# Patient Record
Sex: Female | Born: 1990 | Race: Black or African American | Hispanic: No | Marital: Single | Smoking: Former smoker
Health system: Southern US, Community
[De-identification: ages and names within clinical notes are randomized; demographics above are authoritative.]

## PROBLEM LIST (undated history)

## (undated) DIAGNOSIS — E119 Type 2 diabetes mellitus without complications: Secondary | ICD-10-CM

## (undated) HISTORY — PX: DILITATION & CURRETTAGE/HYSTROSCOPY WITH ESSURE: SHX5573

---

## 2008-07-05 ENCOUNTER — Emergency Department (HOSPITAL_BASED_OUTPATIENT_CLINIC_OR_DEPARTMENT_OTHER): Admission: EM | Admit: 2008-07-05 | Discharge: 2008-07-06 | Payer: Self-pay | Admitting: Emergency Medicine

## 2008-11-12 ENCOUNTER — Emergency Department (HOSPITAL_BASED_OUTPATIENT_CLINIC_OR_DEPARTMENT_OTHER): Admission: EM | Admit: 2008-11-12 | Discharge: 2008-11-12 | Payer: Self-pay | Admitting: Emergency Medicine

## 2009-06-07 ENCOUNTER — Emergency Department (HOSPITAL_BASED_OUTPATIENT_CLINIC_OR_DEPARTMENT_OTHER): Admission: EM | Admit: 2009-06-07 | Discharge: 2009-06-07 | Payer: Self-pay | Admitting: Emergency Medicine

## 2009-07-02 ENCOUNTER — Ambulatory Visit: Payer: Self-pay | Admitting: Diagnostic Radiology

## 2009-07-02 ENCOUNTER — Emergency Department (HOSPITAL_BASED_OUTPATIENT_CLINIC_OR_DEPARTMENT_OTHER): Admission: EM | Admit: 2009-07-02 | Discharge: 2009-07-02 | Payer: Self-pay | Admitting: Emergency Medicine

## 2010-05-06 ENCOUNTER — Emergency Department (HOSPITAL_BASED_OUTPATIENT_CLINIC_OR_DEPARTMENT_OTHER)
Admission: EM | Admit: 2010-05-06 | Discharge: 2010-05-07 | Payer: Self-pay | Source: Home / Self Care | Admitting: Emergency Medicine

## 2010-07-10 LAB — RAPID STREP SCREEN (MED CTR MEBANE ONLY): Streptococcus, Group A Screen (Direct): POSITIVE — AB

## 2010-07-28 LAB — GC/CHLAMYDIA PROBE AMP, GENITAL
Chlamydia, DNA Probe: POSITIVE — AB
GC Probe Amp, Genital: NEGATIVE

## 2010-07-28 LAB — URINALYSIS, ROUTINE W REFLEX MICROSCOPIC: Ketones, ur: NEGATIVE mg/dL

## 2010-07-28 LAB — WET PREP, GENITAL: Yeast Wet Prep HPF POC: NONE SEEN

## 2012-09-13 ENCOUNTER — Emergency Department (HOSPITAL_BASED_OUTPATIENT_CLINIC_OR_DEPARTMENT_OTHER)
Admission: EM | Admit: 2012-09-13 | Discharge: 2012-09-13 | Disposition: A | Discharge status: Home / Self Care | Payer: BC Managed Care – PPO | Attending: Emergency Medicine | Admitting: Emergency Medicine

## 2012-09-13 ENCOUNTER — Encounter (HOSPITAL_BASED_OUTPATIENT_CLINIC_OR_DEPARTMENT_OTHER): Payer: Self-pay

## 2012-09-13 DIAGNOSIS — R1084 Generalized abdominal pain: Secondary | ICD-10-CM | POA: Insufficient documentation

## 2012-09-13 DIAGNOSIS — B9689 Other specified bacterial agents as the cause of diseases classified elsewhere: Secondary | ICD-10-CM | POA: Insufficient documentation

## 2012-09-13 DIAGNOSIS — F17200 Nicotine dependence, unspecified, uncomplicated: Secondary | ICD-10-CM | POA: Insufficient documentation

## 2012-09-13 DIAGNOSIS — N898 Other specified noninflammatory disorders of vagina: Secondary | ICD-10-CM | POA: Insufficient documentation

## 2012-09-13 DIAGNOSIS — N760 Acute vaginitis: Secondary | ICD-10-CM

## 2012-09-13 DIAGNOSIS — A499 Bacterial infection, unspecified: Secondary | ICD-10-CM | POA: Insufficient documentation

## 2012-09-13 DIAGNOSIS — O9989 Other specified diseases and conditions complicating pregnancy, childbirth and the puerperium: Secondary | ICD-10-CM | POA: Insufficient documentation

## 2012-09-13 DIAGNOSIS — R109 Unspecified abdominal pain: Secondary | ICD-10-CM | POA: Insufficient documentation

## 2012-09-13 DIAGNOSIS — Z3490 Encounter for supervision of normal pregnancy, unspecified, unspecified trimester: Secondary | ICD-10-CM

## 2012-09-13 LAB — URINALYSIS, ROUTINE W REFLEX MICROSCOPIC
Bilirubin Urine: NEGATIVE
Glucose, UA: NEGATIVE mg/dL
Hgb urine dipstick: NEGATIVE
Ketones, ur: 15 mg/dL — AB
Leukocytes, UA: NEGATIVE
Nitrite: NEGATIVE
Protein, ur: NEGATIVE mg/dL
Specific Gravity, Urine: 1.029 (ref 1.005–1.030)
Urobilinogen, UA: 1 mg/dL (ref 0.0–1.0)
pH: 7.5 (ref 5.0–8.0)

## 2012-09-13 LAB — BASIC METABOLIC PANEL
BUN: 11 mg/dL (ref 6–23)
CO2: 21 mEq/L (ref 19–32)
Calcium: 9.6 mg/dL (ref 8.4–10.5)
Creatinine, Ser: 0.9 mg/dL (ref 0.50–1.10)
GFR calc Af Amer: >90 mL/min (ref >90)
GFR calc non Af Amer: 89 mL/min — ABNORMAL LOW (ref >90)
Glucose, Bld: 111 mg/dL — ABNORMAL HIGH (ref 70–99)

## 2012-09-13 LAB — CBC WITH DIFFERENTIAL/PLATELET
Basophils Absolute: 0 10*3/uL (ref 0.0–0.1)
Basophils Relative: 0 % (ref 0–1)
Eosinophils Absolute: 0.1 10*3/uL (ref 0.0–0.7)
Hemoglobin: 13.3 g/dL (ref 12.0–15.0)
Lymphs Abs: 3.9 10*3/uL (ref 0.7–4.0)
WBC: 10.7 10*3/uL — ABNORMAL HIGH (ref 4.0–10.5)

## 2012-09-13 LAB — WET PREP, GENITAL: Trich, Wet Prep: NONE SEEN

## 2012-09-13 MED ORDER — METRONIDAZOLE 0.75 % VA GEL: 1.0000 | Freq: Two times a day (BID) | VAGINAL | Status: DC

## 2012-09-13 NOTE — ED Notes (Signed)
Patient here for generalized abdominal cramping x 1 week. Denies nausea, denies pain, denies urinary symptoms. Reports similar to menstrual cramps, no constipation

## 2012-09-13 NOTE — ED Provider Notes (Signed)
History     CSN: [id number]  Arrival date & time 09/13/12  0021   First MD Initiated Contact with Patient 09/13/12 0121      Chief Complaint  Patient presents with  . Abdominal Cramping    (Consider location/radiation/quality/duration/timing/severity/associated sxs/prior treatment) Patient is a 22 y.o. female presenting with cramps. The history is provided by the patient.  Abdominal Cramping This is a new problem. The current episode started more than 1 week ago. The problem occurs constantly. The problem has not changed since onset.Pertinent negatives include no chest pain, no headaches and no shortness of breath. Nothing aggravates the symptoms. Nothing relieves the symptoms. She has tried nothing for the symptoms. The treatment provided no relief.  Midline suprapubic.  No bleeding no discharge  History reviewed. No pertinent past medical history.  History reviewed. No pertinent past surgical history.  No family history on file.  History  Substance Use Topics  . Smoking status: Current Some Day Smoker -- 0.50 packs/day    Types: Cigarettes  . Smokeless tobacco: Not on file  . Alcohol Use: Not on file    OB History   Grav Para Term Preterm Abortions TAB SAB Ect Mult Living                  Review of Systems  Respiratory: Negative for shortness of breath.   Cardiovascular: Negative for chest pain.  Genitourinary: Negative for dysuria, vaginal bleeding and vaginal discharge.  Neurological: Negative for headaches.  All other systems reviewed and are negative.    Allergies  Review of patient's allergies indicates no known allergies.  Home Medications  No current outpatient prescriptions on file.  BP 125/74  Pulse 92  Temp(Src) 99.2 F (37.3 C) (Oral)  Resp 18  Wt 192 lb 11.2 oz (87.408 kg)  SpO2 97%  LMP 08/03/2012  Physical Exam  Constitutional: She is oriented to person, place, and time. She appears well-developed and well-nourished. No distress.   HENT:  Head: Normocephalic and atraumatic.  Mouth/Throat: Oropharynx is clear and moist.  Eyes: Conjunctivae are normal. Pupils are equal, round, and reactive to light.  Neck: Normal range of motion. Neck supple.  Cardiovascular: Normal rate, regular rhythm and intact distal pulses.   Pulmonary/Chest: Effort normal and breath sounds normal.  Abdominal: Soft. Bowel sounds are normal. There is no tenderness. There is no rebound and no guarding.  Genitourinary: Cervix exhibits no motion tenderness. Right adnexum displays no mass and no tenderness. Left adnexum displays no mass and no tenderness. Vaginal discharge found.  Musculoskeletal: Normal range of motion.  Neurological: She is alert and oriented to person, place, and time.  Skin: Skin is warm and dry.  Psychiatric: She has a normal mood and affect.    ED Course  Procedures (including critical care time)  Labs Reviewed  URINALYSIS, ROUTINE W REFLEX MICROSCOPIC - Abnormal; Notable for the following:    Ketones, ur 15 (*)    All other components within normal limits  PREGNANCY, URINE - Abnormal; Notable for the following:    Preg Test, Ur POSITIVE (*)    All other components within normal limits  WET PREP, GENITAL  GC/CHLAMYDIA PROBE AMP  CBC WITH DIFFERENTIAL  BASIC METABOLIC PANEL  HCG, QUANTITATIVE, PREGNANCY   No results found.   No diagnosis found.    MDM  Case d/w Dr. Alyce Pagan follow up in the office on Tuesday   Worsening pain or bleeding return to the closest ED immediately, will provide rx for metrogel  call to see if this an approved first trimester medication.  Follow up in the office on Tuesday.  Patient verbalizes understanding and agrees to follow up      Margart Zemanek Smitty Cords, MD 09/13/12 [phone]

## 2012-09-13 NOTE — ED Notes (Signed)
MD at bedside. 

## 2012-09-14 LAB — GC/CHLAMYDIA PROBE AMP: GC Probe RNA: NEGATIVE

## 2014-10-17 ENCOUNTER — Encounter: Payer: Self-pay | Admitting: Emergency Medicine

## 2014-10-17 ENCOUNTER — Emergency Department (INDEPENDENT_AMBULATORY_CARE_PROVIDER_SITE_OTHER): Payer: BLUE CROSS/BLUE SHIELD

## 2014-10-17 ENCOUNTER — Emergency Department (INDEPENDENT_AMBULATORY_CARE_PROVIDER_SITE_OTHER)
Admission: EM | Admit: 2014-10-17 | Discharge: 2014-10-17 | Disposition: A | Discharge status: Home / Self Care | Payer: BLUE CROSS/BLUE SHIELD | Source: Home / Self Care | Attending: Family Medicine | Admitting: Family Medicine

## 2014-10-17 DIAGNOSIS — M25511 Pain in right shoulder: Secondary | ICD-10-CM

## 2014-10-17 DIAGNOSIS — S161XXA Strain of muscle, fascia and tendon at neck level, initial encounter: Secondary | ICD-10-CM | POA: Diagnosis not present

## 2014-10-17 DIAGNOSIS — S29012A Strain of muscle and tendon of back wall of thorax, initial encounter: Secondary | ICD-10-CM

## 2014-10-17 DIAGNOSIS — S46111A Strain of muscle, fascia and tendon of long head of biceps, right arm, initial encounter: Secondary | ICD-10-CM

## 2014-10-17 DIAGNOSIS — S46211A Strain of muscle, fascia and tendon of other parts of biceps, right arm, initial encounter: Secondary | ICD-10-CM

## 2014-10-17 MED ORDER — HYDROCODONE-ACETAMINOPHEN 5-325 MG PO TABS: ORAL_TABLET | ORAL | Status: DC

## 2014-10-17 MED ORDER — CYCLOBENZAPRINE HCL 10 MG PO TABS: ORAL_TABLET | ORAL | Status: DC

## 2014-10-17 MED ORDER — MELOXICAM 15 MG PO TABS: 15.0000 mg | ORAL_TABLET | Freq: Every day | ORAL | Status: DC
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
## 2014-10-17 NOTE — Discharge Instructions (Signed)
Apply ice pack for 20 to 30 minutes, 3 to 4 times daily  Continue until pain decreases.  Begin range of motion exercises as tolerated.

## 2015-05-15 ENCOUNTER — Emergency Department (HOSPITAL_BASED_OUTPATIENT_CLINIC_OR_DEPARTMENT_OTHER): Payer: BLUE CROSS/BLUE SHIELD

## 2015-05-15 ENCOUNTER — Encounter (HOSPITAL_BASED_OUTPATIENT_CLINIC_OR_DEPARTMENT_OTHER): Payer: Self-pay | Admitting: *Deleted

## 2015-05-15 ENCOUNTER — Emergency Department (HOSPITAL_BASED_OUTPATIENT_CLINIC_OR_DEPARTMENT_OTHER)
Admission: EM | Admit: 2015-05-15 | Discharge: 2015-05-15 | Disposition: A | Discharge status: Home / Self Care | Payer: BLUE CROSS/BLUE SHIELD | Attending: Emergency Medicine | Admitting: Emergency Medicine

## 2015-05-15 DIAGNOSIS — Z87891 Personal history of nicotine dependence: Secondary | ICD-10-CM | POA: Diagnosis not present

## 2015-05-15 DIAGNOSIS — J209 Acute bronchitis, unspecified: Secondary | ICD-10-CM | POA: Diagnosis not present

## 2015-05-15 DIAGNOSIS — H748X1 Other specified disorders of right middle ear and mastoid: Secondary | ICD-10-CM | POA: Insufficient documentation

## 2015-05-15 DIAGNOSIS — J40 Bronchitis, not specified as acute or chronic: Secondary | ICD-10-CM

## 2015-05-15 DIAGNOSIS — R05 Cough: Secondary | ICD-10-CM | POA: Diagnosis present

## 2015-05-15 MED ORDER — AZITHROMYCIN 250 MG PO TABS: 250.0000 mg | ORAL_TABLET | Freq: Every day | ORAL | Status: DC

## 2015-05-15 NOTE — Discharge Instructions (Signed)
Upper Respiratory Infection, Adult Most upper respiratory infections (URIs) are a viral infection of the air passages leading to the lungs. A URI affects the nose, throat, and upper air passages. The most common type of URI is nasopharyngitis and is typically referred to as "the common cold." URIs run their course and usually go away on their own. Most of the time, a URI does not require medical attention, but sometimes a bacterial infection in the upper airways can follow a viral infection. This is called a secondary infection. Sinus and middle ear infections are common types of secondary upper respiratory infections. Bacterial pneumonia can also complicate a URI. A URI can worsen asthma and chronic obstructive pulmonary disease (COPD). Sometimes, these complications can require emergency medical care and may be life threatening.  CAUSES Almost all URIs are caused by viruses. A virus is a type of germ and can spread from one person to another.  RISKS FACTORS You may be at risk for a URI if:   You smoke.   You have chronic heart or lung disease.  You have a weakened defense (immune) system.   You are very young or very old.   You have nasal allergies or asthma.  You work in crowded or poorly ventilated areas.  You work in health care facilities or schools. SIGNS AND SYMPTOMS  Symptoms typically develop 2-3 days after you come in contact with a cold virus. Most viral URIs last 7-10 days. However, viral URIs from the influenza virus (flu virus) can last 14-18 days and are typically more severe. Symptoms may include:   Runny or stuffy (congested) nose.   Sneezing.   Cough.   Sore throat.   Headache.   Fatigue.   Fever.   Loss of appetite.   Pain in your forehead, behind your eyes, and over your cheekbones (sinus pain).  Muscle aches.  DIAGNOSIS  Your health care provider may diagnose a URI by:  Physical exam.  Tests to check that your symptoms are not due to  another condition such as:  Strep throat.  Sinusitis.  Pneumonia.  Asthma. TREATMENT  A URI goes away on its own with time. It cannot be cured with medicines, but medicines may be prescribed or recommended to relieve symptoms. Medicines may help:  Reduce your fever.  Reduce your cough.  Relieve nasal congestion. HOME CARE INSTRUCTIONS   Take medicines only as directed by your health care provider.   Gargle warm saltwater or take cough drops to comfort your throat as directed by your health care provider.  Use a warm mist humidifier or inhale steam from a shower to increase air moisture. This may make it easier to breathe.  Drink enough fluid to keep your urine clear or pale yellow.   Eat soups and other clear broths and maintain good nutrition.   Rest as needed.   Return to work when your temperature has returned to normal or as your health care provider advises. You may need to stay home longer to avoid infecting others. You can also use a face mask and careful hand washing to prevent spread of the virus.  Increase the usage of your inhaler if you have asthma.   Do not use any tobacco products, including cigarettes, chewing tobacco, or electronic cigarettes. If you need help quitting, ask your health care provider. PREVENTION  The best way to protect yourself from getting a cold is to practice good hygiene.   Avoid oral or hand contact with people with cold   symptoms.   Wash your hands often if contact occurs.  There is no clear evidence that vitamin C, vitamin E, echinacea, or exercise reduces the chance of developing a cold. However, it is always recommended to get plenty of rest, exercise, and practice good nutrition.  SEEK MEDICAL CARE IF:   You are getting worse rather than better.   Your symptoms are not controlled by medicine.   You have chills.  You have worsening shortness of breath.  You have brown or red mucus.  You have yellow or brown nasal  discharge.  You have pain in your face, especially when you bend forward.  You have a fever.  You have swollen neck glands.  You have pain while swallowing.  You have white areas in the back of your throat. SEEK IMMEDIATE MEDICAL CARE IF:   You have severe or persistent:  Headache.  Ear pain.  Sinus pain.  Chest pain.  You have chronic lung disease and any of the following:  Wheezing.  Prolonged cough.  Coughing up blood.  A change in your usual mucus.  You have a stiff neck.  You have changes in your:  Vision.  Hearing.  Thinking.  Mood. MAKE SURE YOU:   Understand these instructions.  Will watch your condition.  Will get help right away if you are not doing well or get worse.   This information is not intended to replace advice given to you by your health care provider. Make sure you discuss any questions you have with your health care provider.   Document Released: 10/01/2000 Document Revised: 08/22/2014 Document Reviewed: 07/13/2013 Elsevier Interactive Patient Education 2016 [employer]  

## 2015-05-15 NOTE — ED Provider Notes (Signed)
CSN: [id number]     Arrival date & time 05/15/15  1757 History   First MD Initiated Contact with Patient 05/15/15 1807     Chief Complaint  Patient presents with  . Cough  . Otalgia   HPI  Tracy Davila is a 25 year old female presenting with cough and ear pain. Tracy Davila states her cough has been present for approximately 2 weeks. Tracy Davila states it is intermittently productive of yellow or green phlegm. Tracy Davila states that occasionally Tracy Davila only produces clear mucus. Tracy Davila had associated nasal congestion and sinus headaches approximately one week ago. States Tracy Davila took Sudafed for this which resolved the symptoms. The cough has been persistent. Tracy Davila is also complaining of right ear fullness. Tracy Davila states Tracy Davila feels like there something in there Tracy Davila cannot get out. Denies changes in hearing or pain. Tracy Davila states that the feeling is just uncomfortable. Denies fevers, chills, headaches, neck pain, dizziness, syncope, ear discharge, eye redness, eye discharge, nasal discharge, sore throat, chest pain, shortness of breath, abdominal pain, nausea, vomiting, dysuria or myalgias.  History reviewed. No pertinent past medical history. Past Surgical History  Procedure Laterality Date  . Dilitation & currettage/hystroscopy with essure     Family History  Problem Relation Age of Onset  . Hypertension Mother    Social History  Substance Use Topics  . Smoking status: Former Smoker -- 0.50 packs/day    Types: Cigarettes    Quit date: 05/01/2015  . Smokeless tobacco: Never Used  . Alcohol Use: Yes     Comment: 4 drinks/week   OB History    No data available     Review of Systems  HENT: Positive for ear pain (fullness). Negative for ear discharge and hearing loss.   Respiratory: Positive for cough.   All other systems reviewed and are negative.     Allergies  Review of patient's allergies indicates no known allergies.  Home Medications   Prior to Admission medications   Medication Sig Start Date End Date  Taking? Authorizing Provider  azithromycin (ZITHROMAX) 250 MG tablet Take 1 tablet (250 mg total) by mouth daily. Take first 2 tablets together, then 1 every day until finished. 05/15/15   Raistlin Gum, PA-C  cyclobenzaprine (FLEXERIL) 10 MG tablet Take one tab by mouth at bedtime for muscle spasm 10/17/14   Lattie Haw, MD  HYDROcodone-acetaminophen (NORCO/VICODIN) 5-325 MG per tablet Take one by mouth at bedtime as needed for pain 10/17/14   Lattie Haw, MD  meloxicam (MOBIC) 15 MG tablet Take 1 tablet (15 mg total) by mouth daily. Take with food each morning 10/17/14   Lattie Haw, MD   BP 136/99 mmHg  Pulse 99  Temp(Src) 98.3 F (36.8 C) (Oral)  Resp 16  Ht  (1.626 m)  Wt 99.791 kg  BMI 37.74 kg/m2  SpO2 100% Physical Exam  Constitutional: Tracy Davila appears well-developed and well-nourished. No distress.  HENT:  Head: Normocephalic and atraumatic.  Right Ear: Ear canal normal. A middle ear effusion is present.  Left Ear: Tympanic membrane and ear canal normal.  Nose: Nose normal.  Mouth/Throat: Oropharynx is clear and moist. No oropharyngeal exudate.  Eyes: Conjunctivae are normal. Right eye exhibits no discharge. Left eye exhibits no discharge. No scleral icterus.  Neck: Normal range of motion. Neck supple.  Cardiovascular: Normal rate and regular rhythm.   Pulmonary/Chest: Effort normal and breath sounds normal. No respiratory distress. Tracy Davila has no wheezes. Tracy Davila has no rales.  Breathing unlabored. Patient frequent coughing during interview  and exam. Lungs clear to auscultation bilaterally.  Musculoskeletal: Normal range of motion.  Lymphadenopathy:    Tracy Davila has no cervical adenopathy.  Neurological: Tracy Davila is alert. Coordination normal.  Skin: Skin is warm and dry.  Psychiatric: Tracy Davila has a normal mood and affect. Her behavior is normal.  Nursing note and vitals reviewed.   ED Course  Procedures (including critical care time) Labs Review Labs Reviewed - No data to  display  Imaging Review Dg Chest 2 View  05/15/2015  CLINICAL DATA:  Patient with cough for 2 weeks. EXAM: CHEST  2 VIEW COMPARISON:  None. FINDINGS: The heart size and mediastinal contours are within normal limits. Both lungs are clear. The visualized skeletal structures are unremarkable. IMPRESSION: No active cardiopulmonary disease. Electronically Signed   By: Annia Belt M.D.   On: 05/15/2015 19:06   I have personally reviewed and evaluated these images and lab results as part of my medical decision-making.   EKG Interpretation None      MDM   Final diagnoses:  Bronchitis   Patient presenting with cough x 2 weeks. Also complaints of right ear fullness. VSS. Pt is nontoxic appearing. No nasal musosal edema noted. Right middle ear effusion present without signs of infection. Oropharynx without erythema, edema or exudate. Lungs CTAB. CXR negative for acute infiltrate. Patients symptoms are consistent with URI. Symptoms have been present 10+ days with productive cough; will discharge with zpack. Pt is to follow up with PCP to discuss elevated BP today and ensure symptom resolution. Verbalizes understanding and is agreeable with plan. Pt is hemodynamically stable & in NAD prior to dc.     Alveta Heimlich, PA-C 05/15/15 1933  Rolland Porter, MD 05/24/15 [phone]

## 2015-05-15 NOTE — ED Notes (Signed)
Cough x 1.5 weeks; Ear pain; chest hurts with cough

## 2015-05-15 NOTE — ED Notes (Signed)
Pt given d/c instructions. Verbalizes understanding. No questions. 

## 2015-05-15 NOTE — ED Notes (Signed)
Rolm Gala, PA in to talk with pt about her question regarding cough.

## 2016-05-27 ENCOUNTER — Emergency Department (HOSPITAL_BASED_OUTPATIENT_CLINIC_OR_DEPARTMENT_OTHER)
Admission: EM | Admit: 2016-05-27 | Discharge: 2016-05-27 | Disposition: A | Discharge status: Home / Self Care | Payer: BLUE CROSS/BLUE SHIELD | Attending: Emergency Medicine | Admitting: Emergency Medicine

## 2016-05-27 ENCOUNTER — Encounter (HOSPITAL_BASED_OUTPATIENT_CLINIC_OR_DEPARTMENT_OTHER): Payer: Self-pay

## 2016-05-27 DIAGNOSIS — J019 Acute sinusitis, unspecified: Secondary | ICD-10-CM

## 2016-05-27 DIAGNOSIS — Z87891 Personal history of nicotine dependence: Secondary | ICD-10-CM | POA: Insufficient documentation

## 2016-05-27 DIAGNOSIS — E119 Type 2 diabetes mellitus without complications: Secondary | ICD-10-CM | POA: Insufficient documentation

## 2016-05-27 DIAGNOSIS — Z7984 Long term (current) use of oral hypoglycemic drugs: Secondary | ICD-10-CM | POA: Insufficient documentation

## 2016-05-27 HISTORY — DX: Type 2 diabetes mellitus without complications: E11.9

## 2016-05-27 MED ORDER — SALINE SPRAY 0.65 % NA SOLN: 1.0000 | NASAL | 0 refills | Status: DC | PRN

## 2016-05-27 MED ORDER — MOMETASONE FUROATE 50 MCG/ACT NA SUSP: 2.0000 | Freq: Every day | NASAL | 0 refills | Status: DC

## 2016-05-27 MED ORDER — AMOXICILLIN-POT CLAVULANATE 875-125 MG PO TABS: 1.0000 | ORAL_TABLET | Freq: Two times a day (BID) | ORAL | 0 refills | Status: DC

## 2016-05-27 NOTE — ED Triage Notes (Signed)
C/o "sinus" pain, congestion-started yesterday-NAD-steady gait

## 2016-05-27 NOTE — ED Provider Notes (Signed)
[facility] Provider Note   CSN: [id number] Arrival date & time: 05/27/16  1818   By signing my name below, I, Soijett Blue, attest that this documentation has been prepared under the direction and in the presence of Felicie Morn, NP Electronically Signed: Soijett Blue, ED Scribe. 05/27/16. 9:19 PM.  History   Chief Complaint Chief Complaint  Patient presents with  . Facial Pain    HPI Tracy Davila is a 26 y.o. female with a PMHx of DM, who presents to the Emergency Department complaining of facial pain onset 2 days ago. Pt reports associated nasal congestion, sinus pain, and sinus pressure. Pt has tried sudafed with no relief of her symptoms. She denies fever, chills, cough, nausea, vomiting, and any other symptoms. Pt states that she is compliant with her diabetes medications and her last A1C was 5.9.    The history is provided by the patient. No language interpreter was used.    Past Medical History:  Diagnosis Date  . Diabetes mellitus without complication (HCC)     There are no active problems to display for this patient.   Past Surgical History:  Procedure Laterality Date  . DILITATION & CURRETTAGE/HYSTROSCOPY WITH ESSURE      OB History    No data available       Home Medications    Prior to Admission medications   Medication Sig Start Date End Date Taking? Authorizing Provider  Empagliflozin-Linagliptin (GLYXAMBI PO) Take by mouth.   Yes Historical Provider, MD    Family History Family History  Problem Relation Age of Onset  . Hypertension Mother     Social History Social History  Substance Use Topics  . Smoking status: Former Smoker    Packs/day: 0.50    Types: Cigarettes    Quit date: 05/01/2015  . Smokeless tobacco: Never Used  . Alcohol use Yes     Comment: occ     Allergies   Patient has no known allergies.   Review of Systems Review of Systems  Constitutional: Negative for chills and fever.  HENT: Positive for  sinus pain and sinus pressure.        +facial pain  Respiratory: Negative for cough.   Gastrointestinal: Negative for nausea and vomiting.  All other systems reviewed and are negative.    Physical Exam Updated Vital Signs BP 132/85 (BP Location: Left Arm)   Pulse 92   Temp 98.8 F (37.1 C) (Oral)   Resp 18   Ht 5\' 5"  (1.651 m)   Wt 205 lb (93 kg)   LMP 04/29/2016   SpO2 99%   BMI 34.11 kg/m   Physical Exam  Constitutional: She is oriented to person, place, and time. She appears well-developed and well-nourished. No distress.  HENT:  Head: Normocephalic and atraumatic.  Right Ear: Tympanic membrane, external ear and ear canal normal.  Left Ear: Tympanic membrane, external ear and ear canal normal.  Nose: Mucosal edema present. Right sinus exhibits maxillary sinus tenderness and frontal sinus tenderness. Left sinus exhibits maxillary sinus tenderness and frontal sinus tenderness.  Eyes: EOM are normal.  Neck: Neck supple.  Cardiovascular: Normal rate, regular rhythm and normal heart sounds.  Exam reveals no gallop and no friction rub.   No murmur heard. Pulmonary/Chest: Effort normal and breath sounds normal. No respiratory distress. She has no wheezes. She has no rales.  Abdominal: She exhibits no distension.  Musculoskeletal: Normal range of motion.  Neurological: She is alert and oriented to person, place, and  time.  Skin: Skin is warm and dry.  Psychiatric: She has a normal mood and affect. Her behavior is normal.  Nursing note and vitals reviewed.    ED Treatments / Results  DIAGNOSTIC STUDIES: Oxygen Saturation is 99% on RA, nl by my interpretation.    COORDINATION OF CARE: 9:18 PM Discussed treatment plan with pt at bedside which includes nasonex, nasal spray, and augmentin Rx, and pt agreed to plan.   Procedures Procedures (including critical care time)  Medications Ordered in ED Medications - No data to display   Initial Impression / Assessment and  Plan / ED Course  I have reviewed the triage vital signs and the nursing notes. Pt symptoms consistent with acute rhinosinusitis. Pt will be discharged with symptomatic treatment as well as antibiotic.  Discussed return precautions.  Pt is hemodynamically stable & in NAD prior to discharge.  Final Clinical Impressions(s) / ED Diagnoses   Final diagnoses:  Acute rhinosinusitis    New Prescriptions Discharge Medication List as of 05/27/2016  9:31 PM    START taking these medications   Details  amoxicillin-clavulanate (AUGMENTIN) 875-125 MG tablet Take 1 tablet by mouth 2 (two) times daily. One po bid x 7 days, Starting Tue 05/27/2016, Print    mometasone (NASONEX) 50 MCG/ACT nasal spray Place 2 sprays into the nose daily., Starting Tue 05/27/2016, Print    sodium chloride (OCEAN) 0.65 % SOLN nasal spray Place 1 spray into both nostrils as needed for congestion., Starting Tue 05/27/2016, Print       I personally performed the services described in this documentation, which was scribed in my presence. The recorded information has been reviewed and is accurate.     Felicie Mornavid Thetis Schwimmer, NP 05/28/16 0105    Charlynne Panderavid Hsienta Yao, MD 06/02/16 [phone]

## 2017-03-17 ENCOUNTER — Other Ambulatory Visit: Payer: Self-pay

## 2017-03-17 ENCOUNTER — Encounter (HOSPITAL_BASED_OUTPATIENT_CLINIC_OR_DEPARTMENT_OTHER): Payer: Self-pay

## 2017-03-17 ENCOUNTER — Emergency Department (HOSPITAL_BASED_OUTPATIENT_CLINIC_OR_DEPARTMENT_OTHER)
Admission: EM | Admit: 2017-03-17 | Discharge: 2017-03-17 | Disposition: A | Discharge status: Home / Self Care | Payer: BLUE CROSS/BLUE SHIELD | Attending: Physician Assistant | Admitting: Physician Assistant

## 2017-03-17 DIAGNOSIS — R05 Cough: Secondary | ICD-10-CM | POA: Diagnosis present

## 2017-03-17 DIAGNOSIS — Z87891 Personal history of nicotine dependence: Secondary | ICD-10-CM | POA: Diagnosis not present

## 2017-03-17 DIAGNOSIS — Z79899 Other long term (current) drug therapy: Secondary | ICD-10-CM | POA: Diagnosis not present

## 2017-03-17 DIAGNOSIS — J069 Acute upper respiratory infection, unspecified: Secondary | ICD-10-CM | POA: Insufficient documentation

## 2017-03-17 DIAGNOSIS — B9789 Other viral agents as the cause of diseases classified elsewhere: Secondary | ICD-10-CM | POA: Insufficient documentation

## 2017-03-17 DIAGNOSIS — E119 Type 2 diabetes mellitus without complications: Secondary | ICD-10-CM | POA: Diagnosis not present

## 2017-03-17 LAB — RAPID STREP SCREEN (MED CTR MEBANE ONLY): Streptococcus, Group A Screen (Direct): NEGATIVE

## 2017-03-17 MED ORDER — BENZONATATE 100 MG PO CAPS: 100.0000 mg | ORAL_CAPSULE | Freq: Three times a day (TID) | ORAL | 0 refills | Status: DC

## 2017-03-17 NOTE — Discharge Instructions (Signed)
As discussed, make sure to stay well-hydrated drinking enough fluids to keep your urine clear. Drink tea with honey, cough drops, over-the-counter throat sprays to help soothe your throat. Rapid strep was negative today. Most common cause of sore throat in your age group is viral. Your symptoms are consistent with a viral upper respiratory infection which does not require any antibiotics. Viral infections can take up to 15 days to resolve. Alternate Tylenol and ibuprofen for headache and pain.  Follow-up with your primary care provider as needed.  Return sooner if symptoms worsen or new concerning symptoms in the meantime.

## 2017-03-17 NOTE — ED Triage Notes (Signed)
C/o flu like sx x 5 days-no flu vaccine-NAD-steady gait

## 2017-03-17 NOTE — ED Provider Notes (Signed)
[facility] EMERGENCY DEPARTMENT Provider Note   CSN: [id number] Arrival date & time: 03/17/17  1119     History   Chief Complaint Chief Complaint  Patient presents with  . Cough    HPI Tera HelperKrisha Davila is a 26 y.o. female with past medical history of diabetes presenting with sudden onset sore throat followed by cough and myalgias  Starting 4 days ago. She reports known ill contacts with toddler's. Has not tried anything for her symptoms. She states that the worst part of her symptoms is the sore throat, cough has been mild. She also reports mild frontal pressure headache.   HPI  Past Medical History:  Diagnosis Date  . Diabetes mellitus without complication (HCC)     There are no active problems to display for this patient.   Past Surgical History:  Procedure Laterality Date  . DILITATION & CURRETTAGE/HYSTROSCOPY WITH ESSURE      OB History    No data available       Home Medications    Prior to Admission medications   Medication Sig Start Date End Date Taking? Authorizing Provider  Atorvastatin Calcium (LIPITOR PO) Take by mouth.   Yes [provider]  benzonatate (TESSALON) 100 MG capsule Take 1 capsule (100 mg total) by mouth every 8 (eight) hours. 03/17/17   Georgiana ShoreMitchell, Mylz Yuan B, PA-C    Family History Family History  Problem Relation Age of Onset  . Hypertension Mother     Social History Social History   Tobacco Use  . Smoking status: Former Smoker    Packs/day: 0.50    Types: Cigarettes    Last attempt to quit: 05/01/2015    Years since quitting: 1.8  . Smokeless tobacco: Never Used  Substance Use Topics  . Alcohol use: Yes    Comment: occ  . Drug use: No     Allergies   Patient has no known allergies.   Review of Systems Review of Systems  Constitutional: Negative for chills and fever.  HENT: Positive for congestion, sinus pressure and sore throat. Negative for ear pain, facial swelling, sinus pain, tinnitus,  trouble swallowing and voice change.   Respiratory: Positive for cough. Negative for choking, chest tightness, shortness of breath, wheezing and stridor.   Cardiovascular: Negative for chest pain and palpitations.  Gastrointestinal: Negative for abdominal pain, diarrhea and vomiting.  Genitourinary: Negative for dysuria.  Musculoskeletal: Negative for arthralgias, myalgias, neck pain and neck stiffness.  Skin: Negative for color change, pallor and rash.  Neurological: Positive for headaches. Negative for dizziness, weakness, light-headedness and numbness.     Physical Exam Updated Vital Signs BP 137/85 (BP Location: Left Arm)   Pulse 97   Temp 98.4 F (36.9 C) (Oral)   Resp 18   Ht 5\' 4"  (1.626 m)   Wt 98.7 kg (217 lb 9.5 oz)   LMP 03/10/2017   SpO2 98%   BMI 37.35 kg/m   Physical Exam  Constitutional: She is oriented to person, place, and time. She appears well-developed and well-nourished. No distress.  Patient, nontoxic-appearing, sitting comfortably in bed in no acute distress.  HENT:  Head: Normocephalic and atraumatic.  Right Ear: External ear normal.  Left Ear: External ear normal.  Mouth/Throat: Oropharynx is clear and moist. No oropharyngeal exudate.  Normal TMs bilaterally  Eyes: Conjunctivae and EOM are normal. Pupils are equal, round, and reactive to light. Right eye exhibits no discharge. Left eye exhibits no discharge. No scleral icterus.  Neck: Normal range of motion.  Neck supple.  No meningeal signs  Cardiovascular: Normal rate, regular rhythm and normal heart sounds.  No murmur heard. Pulmonary/Chest: Effort normal and breath sounds normal. No stridor. No respiratory distress. She has no wheezes. She has no rales.  Lungs CTA bilaterally  Abdominal: She exhibits no distension.  Musculoskeletal: Normal range of motion. She exhibits no edema.  Lymphadenopathy:    She has cervical adenopathy.  Neurological: She is alert and oriented to person, place, and  time. No cranial nerve deficit or sensory deficit. She exhibits normal muscle tone.  Normal stance and gait  Skin: Skin is warm and dry. Capillary refill takes less than 2 seconds. No rash noted. She is not diaphoretic. No erythema. No pallor.  Psychiatric: She has a normal mood and affect.  Nursing note and vitals reviewed.    ED Treatments / Results  Labs (all labs ordered are listed, but only abnormal results are displayed) Labs Reviewed  R[facility]ID [facility])  CULTURE, GROUP A STREP [facility])    EKG  EKG Interpretation None       Radiology No results found.  Procedures Procedures (including critical care time)  Medications Ordered in ED Medications - No data to display   Initial Impression / Assessment and Plan / ED Course  I have reviewed the triage vital signs and the nursing notes.  Pertinent labs & imaging results that were available during my care of the patient were reviewed by me and considered in my medical decision making (see chart for details).     Patients symptoms are consistent with URI, likely viral etiology. Discussed that antibiotics are not indicated for viral infections. Pt will be discharged with symptomatic treatment.  Verbalizes understanding and is agreeable with plan.  Pt presents afebrile without tonsillar exudate, negative strep. mild cervical lymphadenopathy, & odynophagia; diagnosis of viral pharyngitis. No abx indicated.   Pt does not appear dehydrated, but did discuss importance of water rehydration. Presentation non concerning for PTA or infxn spread to soft tissue. No trismus or uvula deviation.  Pt able to drink water in ED without difficulty with intact air way.   Discharge home with symptomatic relief and close follow-up with PCP.  Discussed strict return precautions and advised to return to the emergency department if experiencing any new or worsening symptoms. Instructions were understood and patient agreed with discharge  plan.  Final Clinical Impressions(s) / ED Diagnoses   Final diagnoses:  Viral URI with cough    ED Discharge Orders        Ordered    benzonatate (TESSALON) 100 MG capsule  Every 8 hours     03/17/17 1256       Gregary CromerMitchell, Dorris Pierre B, PA-C 03/17/17 1331    Georgiana ShoreMitchell, Aland Chestnutt B, PA-C 03/17/17 1332    Mackuen, Cindee Saltourteney Lyn, MD 03/17/17 1448

## 2017-03-19 LAB — CULTURE, GROUP A STREP (THRC)

## 2017-07-23 IMAGING — CR DG CHEST 2V
2 series · 2 of 2 positions shown · non-contrast
Comparison: None.

CLINICAL DATA: Patient with cough for 2 weeks.

EXAM:
CHEST  2 VIEW

[w chest pa]
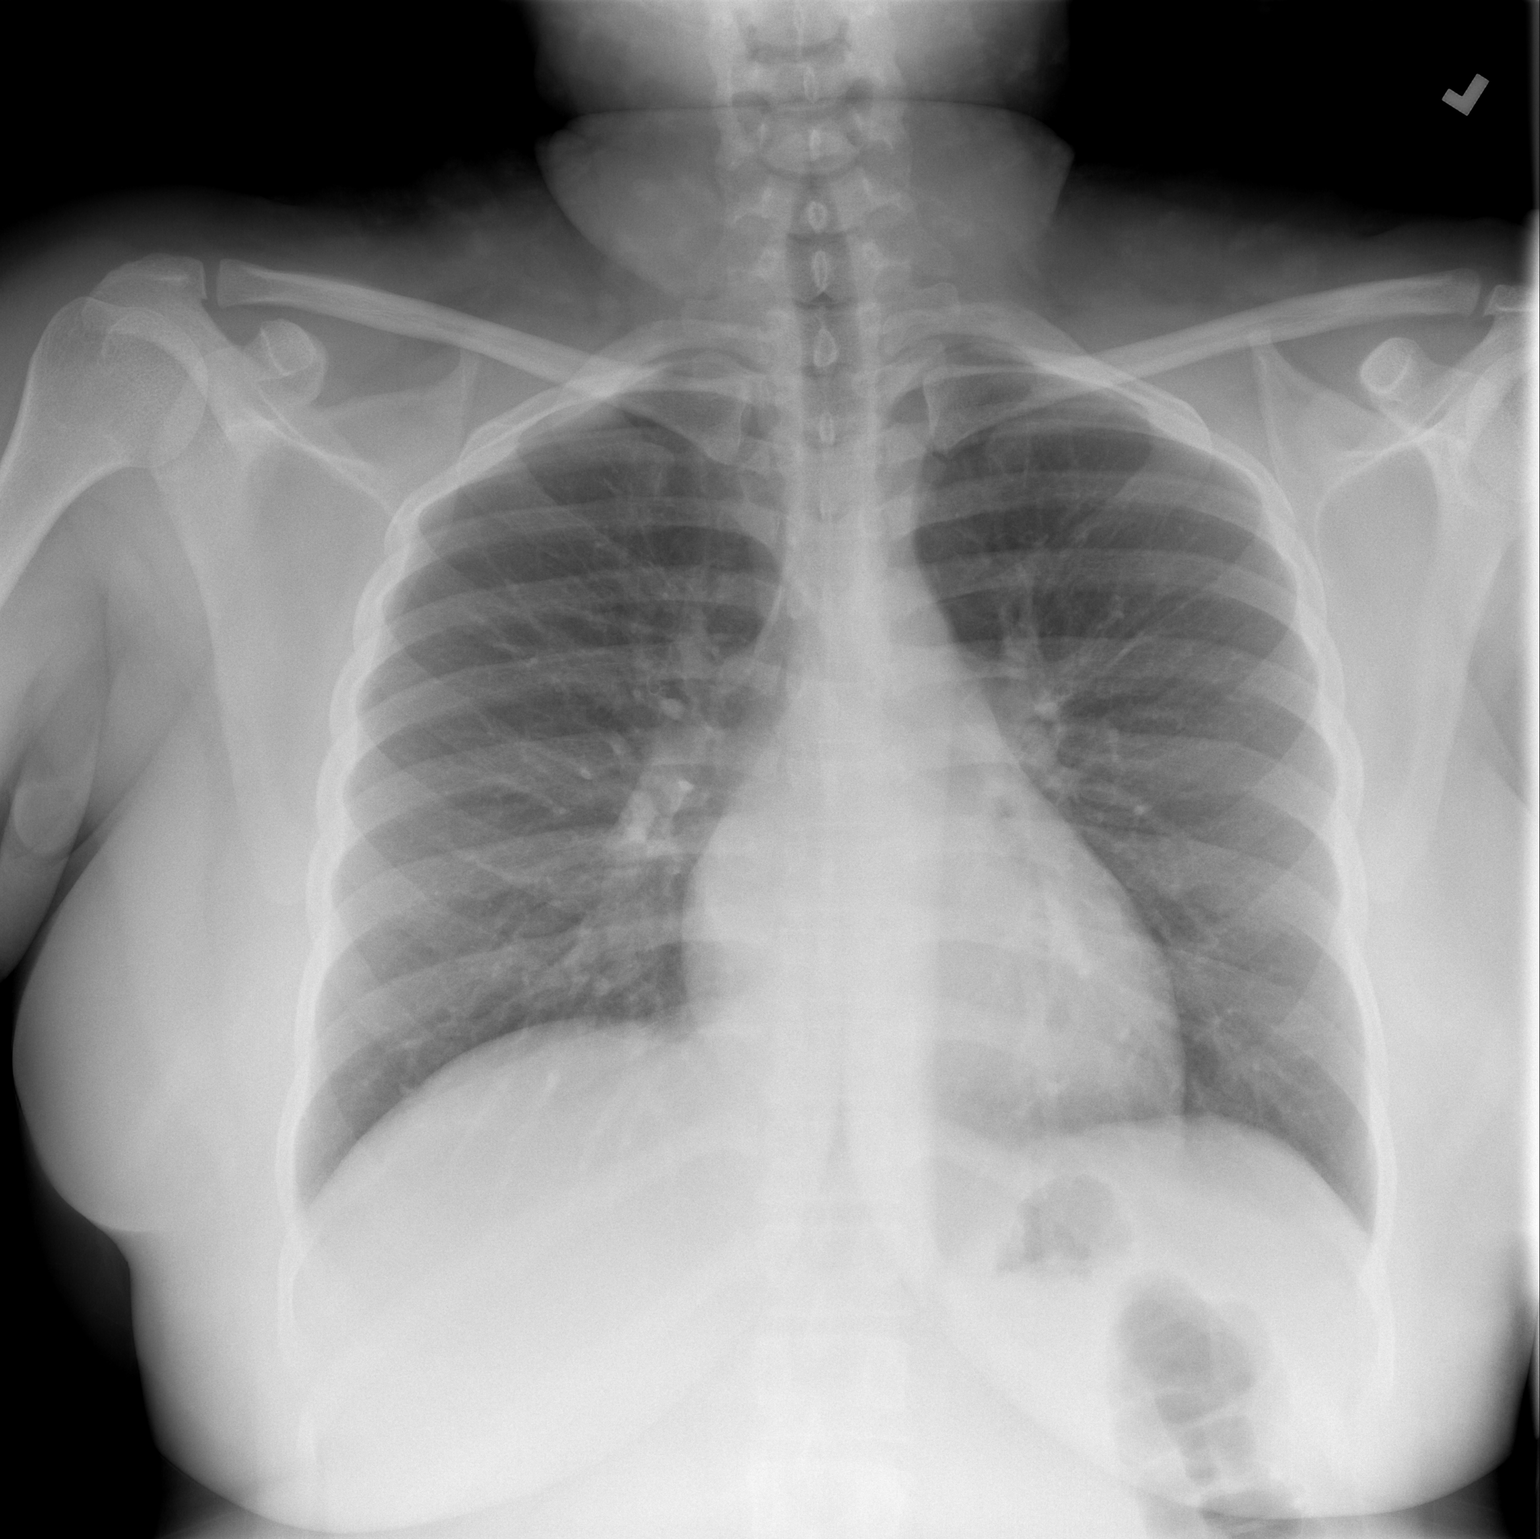

[w chest lat]
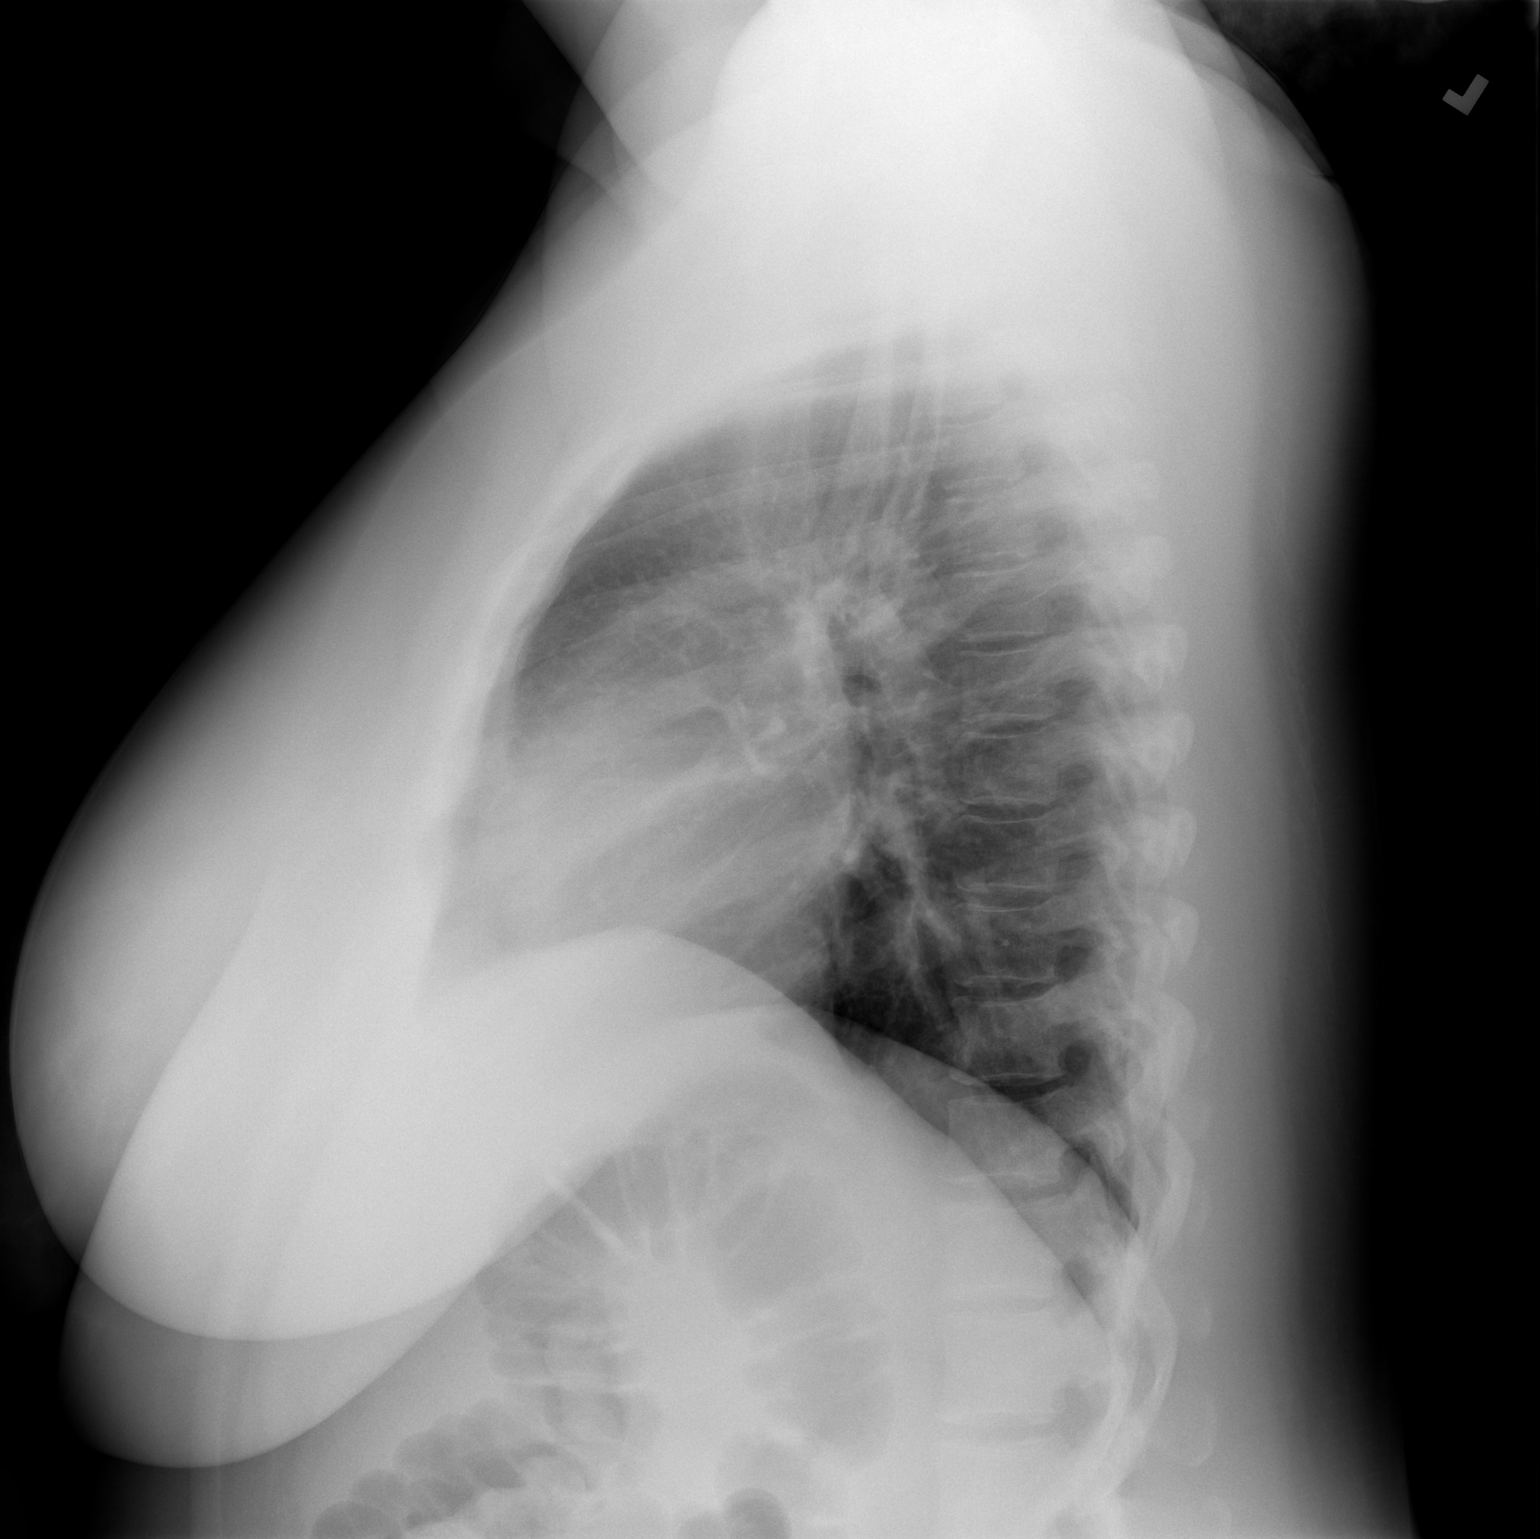

[2 of 2 positions shown; findings below may reference images not displayed]

FINDINGS: The heart size and mediastinal contours are within normal limits.
Both lungs are clear. The visualized skeletal structures are
unremarkable.
IMPRESSION: No active cardiopulmonary disease.

## 2017-11-04 ENCOUNTER — Emergency Department (HOSPITAL_BASED_OUTPATIENT_CLINIC_OR_DEPARTMENT_OTHER)
Admission: EM | Admit: 2017-11-04 | Discharge: 2017-11-04 | Disposition: A | Discharge status: Home / Self Care | Payer: BLUE CROSS/BLUE SHIELD | Attending: Emergency Medicine | Admitting: Emergency Medicine

## 2017-11-04 ENCOUNTER — Other Ambulatory Visit: Payer: Self-pay

## 2017-11-04 ENCOUNTER — Encounter (HOSPITAL_BASED_OUTPATIENT_CLINIC_OR_DEPARTMENT_OTHER): Payer: Self-pay | Admitting: Emergency Medicine

## 2017-11-04 DIAGNOSIS — Z7984 Long term (current) use of oral hypoglycemic drugs: Secondary | ICD-10-CM | POA: Insufficient documentation

## 2017-11-04 DIAGNOSIS — R110 Nausea: Secondary | ICD-10-CM | POA: Insufficient documentation

## 2017-11-04 DIAGNOSIS — E119 Type 2 diabetes mellitus without complications: Secondary | ICD-10-CM | POA: Insufficient documentation

## 2017-11-04 DIAGNOSIS — Z87891 Personal history of nicotine dependence: Secondary | ICD-10-CM | POA: Diagnosis not present

## 2017-11-04 DIAGNOSIS — Z79899 Other long term (current) drug therapy: Secondary | ICD-10-CM | POA: Diagnosis not present

## 2017-11-04 DIAGNOSIS — R51 Headache: Secondary | ICD-10-CM | POA: Diagnosis not present

## 2017-11-04 DIAGNOSIS — R519 Headache, unspecified: Secondary | ICD-10-CM

## 2017-11-04 LAB — CBC
HCT: 38.5 % (ref 36.0–46.0)
Hemoglobin: 12.7 g/dL (ref 12.0–15.0)
MCH: 26.1 pg (ref 26.0–34.0)
MCHC: 33 g/dL (ref 30.0–36.0)
MCV: 79.1 fL (ref 78.0–100.0)
PLATELETS: 395 10*3/uL (ref 150–400)
RBC: 4.87 MIL/uL (ref 3.87–5.11)
RDW: 15.6 % — AB (ref 11.5–15.5)
WBC: 8.3 10*3/uL (ref 4.0–10.5)

## 2017-11-04 LAB — URINALYSIS, ROUTINE W REFLEX MICROSCOPIC
BILIRUBIN URINE: NEGATIVE
GLUCOSE, UA: NEGATIVE mg/dL
KETONES UR: NEGATIVE mg/dL
LEUKOCYTES UA: NEGATIVE
Nitrite: NEGATIVE
PROTEIN: NEGATIVE mg/dL
Specific Gravity, Urine: 1.025 (ref 1.005–1.030)
pH: 6 (ref 5.0–8.0)

## 2017-11-04 LAB — BASIC METABOLIC PANEL
Anion gap: 11 (ref 5–15)
BUN: 9 mg/dL (ref 6–20)
CHLORIDE: 102 mmol/L (ref 98–111)
CO2: 23 mmol/L (ref 22–32)
CREATININE: 0.81 mg/dL (ref 0.44–1.00)
Calcium: 8.8 mg/dL — ABNORMAL LOW (ref 8.9–10.3)
GFR calc Af Amer: >60 mL/min (ref >60)
Glucose, Bld: 199 mg/dL — ABNORMAL HIGH (ref 70–99)
Potassium: 3.7 mmol/L (ref 3.5–5.1)
SODIUM: 136 mmol/L (ref 135–145)

## 2017-11-04 LAB — URINALYSIS, MICROSCOPIC (REFLEX)

## 2017-11-04 LAB — PREGNANCY, URINE: Preg Test, Ur: NEGATIVE

## 2017-11-04 MED ORDER — ONDANSETRON HCL 4 MG/2ML IJ SOLN
4.0000 mg | Freq: Once | INTRAMUSCULAR | Status: AC
  Administered 2017-11-04: 4 mg via INTRAVENOUS
  Filled 2017-11-04: qty 2

## 2017-11-04 MED ORDER — KETOROLAC TROMETHAMINE 30 MG/ML IJ SOLN
30.0000 mg | Freq: Once | INTRAMUSCULAR | Status: AC
  Administered 2017-11-04: 30 mg via INTRAVENOUS
  Filled 2017-11-04: qty 1

## 2017-11-04 MED ORDER — SODIUM CHLORIDE 0.9 % IV BOLUS
1000.0000 mL | Freq: Once | INTRAVENOUS | Status: AC
  Administered 2017-11-04: 1000 mL via INTRAVENOUS

## 2017-11-04 MED ORDER — ONDANSETRON 8 MG PO TBDP: 8.0000 mg | ORAL_TABLET | Freq: Three times a day (TID) | ORAL | 0 refills | Status: AC | PRN

## 2017-11-04 MED ORDER — PROCHLORPERAZINE EDISYLATE 10 MG/2ML IJ SOLN
10.0000 mg | Freq: Once | INTRAMUSCULAR | Status: AC
  Administered 2017-11-04: 10 mg via INTRAVENOUS
  Filled 2017-11-04: qty 2

## 2017-11-04 NOTE — ED Provider Notes (Signed)
[facility] EMERGENCY DEPARTMENT Provider Note   CSN: [id number] Arrival date & time: 11/04/17  0808     History   Chief Complaint Chief Complaint  Patient presents with  . Headache  . Flank Pain    HPI Tracy Davila is a 27 y.o. female.  HPI Patient is a 27 year old female presents the emergency department with complaints of nausea and headache.  She reports her headache is tension-like and located in the front right of her head.  She denies vomiting.  Denies injury or trauma to her head.  Denies weakness of her arms or legs.  She reports a history of migraine headaches but states this feels slightly different.  No fevers or chills.  No neck pain or neck stiffness.  Also reports a rash of her bilateral groin.    Past Medical History:  Diagnosis Date  . Diabetes mellitus without complication (HCC)     There are no active problems to display for this patient.   Past Surgical History:  Procedure Laterality Date  . DILITATION & CURRETTAGE/HYSTROSCOPY WITH ESSURE       OB History   None      Home Medications    Prior to Admission medications   Medication Sig Start Date End Date Taking? Authorizing Provider  sitaGLIPtin (JANUVIA) 100 MG tablet Take 100 mg by mouth daily.   Yes [provider]  Atorvastatin Calcium (LIPITOR PO) Take by mouth.    [provider]  benzonatate (TESSALON) 100 MG capsule Take 1 capsule (100 mg total) by mouth every 8 (eight) hours. 03/17/17   Mathews RobinsonsMitchell, Jessica B, PA-C  ondansetron (ZOFRAN ODT) 8 MG disintegrating tablet Take 1 tablet (8 mg total) by mouth every 8 (eight) hours as needed for nausea or vomiting. 11/04/17   Azalia Bilisampos, Rehaan Viloria, MD    Family History Family History  Problem Relation Age of Onset  . Hypertension Mother     Social History Social History   Tobacco Use  . Smoking status: Former Smoker    Packs/day: 0.50    Types: Cigarettes    Last attempt to quit: 05/01/2015    Years since  quitting: 2.5  . Smokeless tobacco: Never Used  Substance Use Topics  . Alcohol use: Yes    Comment: occ  . Drug use: No     Allergies   Patient has no known allergies.   Review of Systems Review of Systems  All other systems reviewed and are negative.    Physical Exam Updated Vital Signs BP (!) 132/92   Pulse (!) 110   Temp 98.8 F (37.1 C) (Oral)   Resp 18   Ht 5\' 5"  (1.651 m)   Wt 93.4 kg (206 lb)   LMP 10/28/2017 (Approximate)   SpO2 99%   BMI 34.28 kg/m   Physical Exam  Constitutional: She is oriented to person, place, and time. She appears well-developed and well-nourished.  HENT:  Head: Normocephalic and atraumatic.  Eyes: EOM are normal.  Neck: Normal range of motion.  Cardiovascular: Normal rate and regular rhythm.  Pulmonary/Chest: Effort normal and breath sounds normal.  Abdominal: Soft. She exhibits no distension. There is no tenderness.  Genitourinary:  Genitourinary Comments: Chaperone present.  Rash of her bilateral inguinal regions consistent with dermatitis  Musculoskeletal: Normal range of motion.  5 out of 5 strength in bilateral upper and lower extremity major muscle groups.  Neurological: She is alert and oriented to person, place, and time.  Skin: Skin is warm.  Psychiatric:  She has a normal mood and affect.  Nursing note and vitals reviewed.    ED Treatments / Results  Labs (all labs ordered are listed, but only abnormal results are displayed) Labs Reviewed  URINALYSIS, ROUTINE W REFLEX MICROSCOPIC - Abnormal; Notable for the following components:      Result Value   Hgb urine dipstick LARGE (*)    All other components within normal limits  CBC - Abnormal; Notable for the following components:   RDW 15.6 (*)    All other components within normal limits  BASIC METABOLIC PANEL - Abnormal; Notable for the following components:   Glucose, Bld 199 (*)    Calcium 8.8 (*)    All other components within normal limits  URINALYSIS,  MICROSCOPIC (REFLEX) - Abnormal; Notable for the following components:   Bacteria, UA MANY (*)    All other components within normal limits  PREGNANCY, URINE    EKG None  Radiology No results found.  Procedures Procedures (including critical care time)  Medications Ordered in ED Medications  ketorolac (TORADOL) 30 MG/ML injection 30 mg (30 mg Intravenous Given 11/04/17 0852)  sodium chloride 0.9 % bolus 1,000 mL (1,000 mLs Intravenous New Bag/Given 11/04/17 0850)  ondansetron (ZOFRAN) injection 4 mg (4 mg Intravenous Given 11/04/17 0852)  prochlorperazine (COMPAZINE) injection 10 mg (10 mg Intravenous Given 11/04/17 0946)     Initial Impression / Assessment and Plan / ED Course  I have reviewed the triage vital signs and the nursing notes.  Pertinent labs & imaging results that were available during my care of the patient were reviewed by me and considered in my medical decision making (see chart for details).     Patient is overall well-appearing.  Nonfocal neurologic exam.  No indication for advanced imaging.  Feels better after fluids and Toradol and Compazine.  Nonspecific headache  Her rash is consistent with a heat and moisture related dermatitis.  Recommended barrier cream.  Patient recommended to follow-up with her primary care physician in the next several days as needed for persistent symptoms.  She understands return to the emergency department for new or worsening symptoms  Final Clinical Impressions(s) / ED Diagnoses   Final diagnoses:  Acute nonintractable headache, unspecified headache type  Nausea    ED Discharge Orders        Ordered    ondansetron (ZOFRAN ODT) 8 MG disintegrating tablet  Every 8 hours PRN     11/04/17 1005       Azalia Bilis, MD 11/04/17 1010

## 2017-11-04 NOTE — ED Triage Notes (Addendum)
Patient reports headache, nausea, taking OTC meds without relief.  States this has been going on for 3 days.  Reports bilateral flank pain as well.  Denies dysuria.  Additionally reports a rash to vagina.

## 2018-03-25 ENCOUNTER — Emergency Department (HOSPITAL_BASED_OUTPATIENT_CLINIC_OR_DEPARTMENT_OTHER)
Admission: EM | Admit: 2018-03-25 | Discharge: 2018-03-25 | Disposition: A | Discharge status: Home / Self Care | Payer: BLUE CROSS/BLUE SHIELD | Attending: Emergency Medicine | Admitting: Emergency Medicine

## 2018-03-25 ENCOUNTER — Encounter (HOSPITAL_BASED_OUTPATIENT_CLINIC_OR_DEPARTMENT_OTHER): Payer: Self-pay

## 2018-03-25 ENCOUNTER — Other Ambulatory Visit: Payer: Self-pay

## 2018-03-25 DIAGNOSIS — Z87891 Personal history of nicotine dependence: Secondary | ICD-10-CM | POA: Insufficient documentation

## 2018-03-25 DIAGNOSIS — Z79899 Other long term (current) drug therapy: Secondary | ICD-10-CM | POA: Insufficient documentation

## 2018-03-25 DIAGNOSIS — E119 Type 2 diabetes mellitus without complications: Secondary | ICD-10-CM | POA: Diagnosis not present

## 2018-03-25 DIAGNOSIS — R05 Cough: Secondary | ICD-10-CM | POA: Diagnosis present

## 2018-03-25 DIAGNOSIS — J069 Acute upper respiratory infection, unspecified: Secondary | ICD-10-CM | POA: Insufficient documentation

## 2018-03-25 DIAGNOSIS — B9789 Other viral agents as the cause of diseases classified elsewhere: Secondary | ICD-10-CM | POA: Diagnosis not present

## 2018-03-25 DIAGNOSIS — Z7984 Long term (current) use of oral hypoglycemic drugs: Secondary | ICD-10-CM | POA: Diagnosis not present

## 2018-03-25 MED ORDER — PHENYLEPHRINE HCL 10 MG PO TABS
10.0000 mg | ORAL_TABLET | Freq: Once | ORAL | Status: DC
  Filled 2018-03-25: qty 1

## 2018-03-25 MED ORDER — IBUPROFEN 800 MG PO TABS
800.0000 mg | ORAL_TABLET | Freq: Once | ORAL | Status: AC
  Administered 2018-03-25: 800 mg via ORAL
  Filled 2018-03-25: qty 1

## 2018-03-25 MED ORDER — PSEUDOEPHEDRINE HCL 30 MG PO TABS
30.0000 mg | ORAL_TABLET | Freq: Once | ORAL | Status: AC
  Administered 2018-03-25: 30 mg via ORAL
  Filled 2018-03-25: qty 1

## 2018-03-25 MED ORDER — DIPHENHYDRAMINE HCL 25 MG PO CAPS
25.0000 mg | ORAL_CAPSULE | Freq: Once | ORAL | Status: AC
  Administered 2018-03-25: 25 mg via ORAL
  Filled 2018-03-25: qty 1

## 2018-03-25 MED ORDER — DIPHENHYDRAMINE HCL 25 MG PO CAPS: 25.0000 mg | ORAL_CAPSULE | Freq: Four times a day (QID) | ORAL | 0 refills | Status: AC | PRN

## 2018-03-25 MED ORDER — PSEUDOEPHEDRINE HCL 30 MG PO TABS: 30.0000 mg | ORAL_TABLET | ORAL | 0 refills | Status: AC | PRN

## 2018-03-25 NOTE — ED Triage Notes (Signed)
C.o flu like sx day 5-seen at [facility] 2 days-dx with "URI"- rx inhaler and cough med-pt states she feels worse-NAD-steady gait

## 2018-03-25 NOTE — ED Notes (Signed)
Pt reports taking nyquil, sudafed, tessalon, and inhalers with no relief.

## 2018-03-25 NOTE — ED Provider Notes (Signed)
[facility] EMERGENCY DEPARTMENT Provider Note   CSN: [id number] Arrival date & time: 03/25/18  2035     History   Chief Complaint Chief Complaint  Patient presents with  . Cough    HPI Tracy Davila is a 27 y.o. female.  Patient with 5 days of cough and URI symptoms.  URI symptoms have mostly abated with cough.  Patient has been seen at urgent care where she was prescribed albuterol inhaler, Tessalon Perles.  These of helped somewhat helpful.  Patient also complains of pain with each cough in her chest and her back.  Describes it is muscles being pulled.  Patient says that she is short of breath when coughing, but does not have short of breath outside of this.  Patient is not been a long car ride, does not smoke, is not on OCPs.  Does not have any calf pain or calf swelling.  The history is provided by the patient.  Cough  This is a new problem. The current episode started more than 1 week ago. The problem occurs every few minutes. The cough is non-productive. The maximum temperature recorded prior to her arrival was 102 to 102.9 F. The fever has been present for less than 1 day. Associated symptoms include chest pain, headaches, rhinorrhea and shortness of breath. Pertinent negatives include no ear congestion. She has tried decongestants and cough syrup for the symptoms. The treatment provided mild relief. She is not a smoker. Her past medical history does not include pneumonia, COPD or asthma.    Past Medical History:  Diagnosis Date  . Diabetes mellitus without complication (HCC)     There are no active problems to display for this patient.   Past Surgical History:  Procedure Laterality Date  . DILITATION & CURRETTAGE/HYSTROSCOPY WITH ESSURE       OB History   None      Home Medications    Prior to Admission medications   Medication Sig Start Date End Date Taking? Authorizing Provider  Atorvastatin Calcium (LIPITOR PO) Take by mouth.    [provider]  benzonatate (TESSALON) 100 MG capsule Take 1 capsule (100 mg total) by mouth every 8 (eight) hours. 03/17/17   Georgiana ShoreMitchell, Jessica B, PA-C  diphenhydrAMINE (BENADRYL ALLERGY) 25 mg capsule Take 1 capsule (25 mg total) by mouth every 6 (six) hours as needed. 03/25/18   Garnette Gunnerhompson, Elara Cocke B, MD  ondansetron (ZOFRAN ODT) 8 MG disintegrating tablet Take 1 tablet (8 mg total) by mouth every 8 (eight) hours as needed for nausea or vomiting. 11/04/17   Azalia Bilisampos, Kevin, MD  pseudoephedrine (SUDAFED) 30 MG tablet Take 1 tablet (30 mg total) by mouth every 4 (four) hours as needed for congestion. 03/25/18   Garnette Gunnerhompson, Robby Bulkley B, MD  sitaGLIPtin (JANUVIA) 100 MG tablet Take 100 mg by mouth daily.    [provider]    Family History Family History  Problem Relation Age of Onset  . Hypertension Mother     Social History Social History   Tobacco Use  . Smoking status: Former Smoker    Packs/day: 0.50    Types: Cigarettes    Last attempt to quit: 05/01/2015    Years since quitting: 2.9  . Smokeless tobacco: Never Used  Substance Use Topics  . Alcohol use: Yes    Comment: occ  . Drug use: No     Allergies   Patient has no known allergies.   Review of Systems Review of Systems  HENT: Positive for  rhinorrhea.   Respiratory: Positive for cough and shortness of breath.   Cardiovascular: Positive for chest pain.  Neurological: Positive for headaches.     Physical Exam Updated Vital Signs BP 125/76   Pulse (!) 112   Temp 98.9 F (37.2 C) (Oral)   Resp 18   Ht 5\' 5"  (1.651 m)   Wt 91.2 kg   LMP 02/27/2018 (Approximate)   SpO2 99%   BMI 33.45 kg/m   Physical Exam  Constitutional: She is oriented to person, place, and time. She appears well-developed and well-nourished. No distress.  HENT:  Head: Normocephalic and atraumatic.  Eyes: Right eye exhibits no discharge. Left eye exhibits no discharge.  Neck: No JVD present.  Cardiovascular: Regular rhythm. Exam reveals  no friction rub.  No murmur heard. Tachycardic  Pulmonary/Chest: Effort normal and breath sounds normal. She has no wheezes.  Actively coughing  Abdominal: Soft. Bowel sounds are normal. She exhibits no distension. There is no tenderness.  Musculoskeletal: Normal range of motion. She exhibits no edema.  Negative Homans sign bilaterally  Neurological: She is alert and oriented to person, place, and time.  Skin: Skin is warm and dry. Capillary refill takes less than 2 seconds.  Psychiatric: She has a normal mood and affect. Her behavior is normal.     ED Treatments / Results  Labs (all labs ordered are listed, but only abnormal results are displayed) Labs Reviewed - No data to display  EKG None  Radiology No results found.  Procedures Procedures (including critical care time)  Medications Ordered in ED Medications  phenylephrine (SUDAFED PE) tablet 10 mg (has no administration in time range)  diphenhydrAMINE (BENADRYL) capsule 25 mg (has no administration in time range)  ibuprofen (ADVIL,MOTRIN) tablet 800 mg (800 mg Oral Given 03/25/18 2140)     Initial Impression / Assessment and Plan / ED Course  I have reviewed the triage vital signs and the nursing notes.  Pertinent labs & imaging results that were available during my care of the patient were reviewed by me and considered in my medical decision making (see chart for details).     Patient presenting with cough, congestion, tachycardia.  Symptoms likely related to URI patient has had over the past week.  Patient tachycardic, likely due to patient's use of albuterol inhaler.  Low suspicion of PE as patient is not hypoxic, no calf tenderness, does not smoke, not on OCPs. -Recommend combination Sudafed/first-generation antihistamine for URI symptoms and cough -May continue home Tessalon Perles, albuterol inhaler as needed -Ibuprofen for chest soreness from cough  Final Clinical Impressions(s) / ED Diagnoses   Final  diagnoses:  Viral URI with cough    ED Discharge Orders         Ordered    pseudoephedrine (SUDAFED) 30 MG tablet  Every 4 hours PRN     03/25/18 2146    diphenhydrAMINE (BENADRYL ALLERGY) 25 mg capsule  Every 6 hours PRN     03/25/18 2146           Garnette Gunner, MD 03/25/18 2157    Marily Memos, MD 03/26/18 0021

## 2020-06-27 ENCOUNTER — Emergency Department (HOSPITAL_BASED_OUTPATIENT_CLINIC_OR_DEPARTMENT_OTHER): Payer: BC Managed Care – PPO

## 2020-06-27 ENCOUNTER — Emergency Department (HOSPITAL_BASED_OUTPATIENT_CLINIC_OR_DEPARTMENT_OTHER)
Admission: EM | Admit: 2020-06-27 | Discharge: 2020-06-27 | Disposition: A | Discharge status: Home / Self Care | Payer: BC Managed Care – PPO | Attending: Emergency Medicine | Admitting: Emergency Medicine

## 2020-06-27 ENCOUNTER — Encounter (HOSPITAL_BASED_OUTPATIENT_CLINIC_OR_DEPARTMENT_OTHER): Payer: Self-pay

## 2020-06-27 ENCOUNTER — Other Ambulatory Visit: Payer: Self-pay

## 2020-06-27 DIAGNOSIS — E119 Type 2 diabetes mellitus without complications: Secondary | ICD-10-CM | POA: Insufficient documentation

## 2020-06-27 DIAGNOSIS — O209 Hemorrhage in early pregnancy, unspecified: Secondary | ICD-10-CM | POA: Insufficient documentation

## 2020-06-27 DIAGNOSIS — Z3A01 Less than 8 weeks gestation of pregnancy: Secondary | ICD-10-CM | POA: Insufficient documentation

## 2020-06-27 DIAGNOSIS — O26891 Other specified pregnancy related conditions, first trimester: Secondary | ICD-10-CM | POA: Insufficient documentation

## 2020-06-27 DIAGNOSIS — M545 Low back pain, unspecified: Secondary | ICD-10-CM | POA: Diagnosis not present

## 2020-06-27 DIAGNOSIS — O4690 Antepartum hemorrhage, unspecified, unspecified trimester: Secondary | ICD-10-CM

## 2020-06-27 DIAGNOSIS — Z87891 Personal history of nicotine dependence: Secondary | ICD-10-CM | POA: Insufficient documentation

## 2020-06-27 DIAGNOSIS — Z7984 Long term (current) use of oral hypoglycemic drugs: Secondary | ICD-10-CM | POA: Insufficient documentation

## 2020-06-27 DIAGNOSIS — R102 Pelvic and perineal pain: Secondary | ICD-10-CM | POA: Diagnosis not present

## 2020-06-27 DIAGNOSIS — N939 Abnormal uterine and vaginal bleeding, unspecified: Secondary | ICD-10-CM

## 2020-06-27 LAB — CBC WITH DIFFERENTIAL/PLATELET
Abs Immature Granulocytes: 0.05 10*3/uL (ref 0.00–0.07)
Basophils Absolute: 0 10*3/uL (ref 0.0–0.1)
Basophils Relative: 0 %
Eosinophils Absolute: 0 10*3/uL (ref 0.0–0.5)
Eosinophils Relative: 0 %
HCT: 40.3 % (ref 36.0–46.0)
Hemoglobin: 13.2 g/dL (ref 12.0–15.0)
Immature Granulocytes: 1 %
Lymphocytes Relative: 31 %
Lymphs Abs: 3 10*3/uL (ref 0.7–4.0)
MCH: 25.4 pg — ABNORMAL LOW (ref 26.0–34.0)
MCHC: 32.8 g/dL (ref 30.0–36.0)
MCV: 77.5 fL — ABNORMAL LOW (ref 80.0–100.0)
Monocytes Absolute: 0.8 10*3/uL (ref 0.1–1.0)
Monocytes Relative: 9 %
Neutro Abs: 5.7 10*3/uL (ref 1.7–7.7)
Neutrophils Relative %: 59 %
Platelets: 391 10*3/uL (ref 150–400)
RBC: 5.2 MIL/uL — ABNORMAL HIGH (ref 3.87–5.11)
RDW: 16.7 % — ABNORMAL HIGH (ref 11.5–15.5)
WBC: 9.7 10*3/uL (ref 4.0–10.5)
nRBC: 0 % (ref 0.0–0.2)

## 2020-06-27 LAB — BASIC METABOLIC PANEL
Anion gap: 11 (ref 5–15)
BUN: 12 mg/dL (ref 6–20)
CO2: 18 mmol/L — ABNORMAL LOW (ref 22–32)
Calcium: 8.9 mg/dL (ref 8.9–10.3)
Chloride: 105 mmol/L (ref 98–111)
Creatinine, Ser: 0.82 mg/dL (ref 0.44–1.00)
GFR, Estimated: >60 mL/min (ref >60)
Glucose, Bld: 140 mg/dL — ABNORMAL HIGH (ref 70–99)
Potassium: 3.6 mmol/L (ref 3.5–5.1)
Sodium: 134 mmol/L — ABNORMAL LOW (ref 135–145)

## 2020-06-27 LAB — HCG, QUANTITATIVE, PREGNANCY: hCG, Beta Chain, Quant, S: 5721 m[IU]/mL — ABNORMAL HIGH (ref <5)

## 2020-06-27 LAB — PREGNANCY, URINE: Preg Test, Ur: POSITIVE — AB

## 2020-06-27 LAB — ABO/RH: ABO/RH(D): O POS

## 2020-06-27 MED ORDER — ACETAMINOPHEN 325 MG PO TABS
650.0000 mg | ORAL_TABLET | Freq: Once | ORAL | Status: AC
  Administered 2020-06-27: 650 mg via ORAL
  Filled 2020-06-27: qty 2

## 2020-06-27 MED ORDER — ONDANSETRON 4 MG PO TBDP: 4.0000 mg | ORAL_TABLET | Freq: Once | ORAL | Status: DC

## 2020-06-27 MED ORDER — ONDANSETRON 4 MG PO TBDP
4.0000 mg | ORAL_TABLET | Freq: Once | ORAL | Status: AC
  Administered 2020-06-27: 4 mg via ORAL
  Filled 2020-06-27: qty 1

## 2020-06-27 NOTE — ED Triage Notes (Signed)
Pt c/o vaginal spotting started this am-pad 1 in place-pt states she had +home preg test-no medical follow up-NAD-steady gait

## 2020-06-27 NOTE — Discharge Instructions (Signed)
You were seen in the ER for your vaginal bleeding in early pregnancy.  Your blood work and physical exam was reassuring.  Your ultrasound revealed a pregnancy within your uterus.  There is a gestational sac present, but it is too early in your pregnancy to visualize a fetus growing within your uterus.  It is possible this is normal bleeding in early pregnancy, however there is also the potential that this is an early sign of impending miscarriage You need to follow-up with your OB/GYN in 48 hours for repeat quantitative hCG test to determine if this pregnancy appears to be viable.  Return to the emergency department develop any severe abdominal pain, nausea or vomiting does not stop, or any other new severe symptoms.

## 2020-06-27 NOTE — ED Provider Notes (Signed)
[facility] EMERGENCY DEPARTMENT Provider Note   CSN: [id number] Arrival date & time: 06/27/20  1719     History Chief Complaint  Patient presents with  . Vaginal Bleeding    Tracy Davila is a 30 y.o. female who presents with concern for vaginal bleeding in context of positive home pregnancy test.  LMP 05/08/2020.  Patient has history of emergency C-section at 24 weeks in April 2021 secondary to PPROM and placental abruption.  Unfortunately her son did not survive.  Patient is extremely anxious, states that she was not trying to get pregnant and is feeling very overwhelmed.  She endorses she woke around 5 AM with severe low back pain, and has developed scant persistent vaginal bleeding and bilateral pelvic cramping since that time.  She additionally endorses vaginal itching, but endorses recurrent vaginal yeast infection secondary to known diabetes.  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  HPI     Past Medical History:  Diagnosis Date  . Diabetes mellitus without complication (HCC)     There are no problems to display for this patient.   Past Surgical History:  Procedure Laterality Date  . CESAREAN SECTION    . DILITATION & CURRETTAGE/HYSTROSCOPY WITH ESSURE       OB History   No obstetric history on file.     Family History  Problem Relation Age of Onset  . Hypertension Mother     Social History   Tobacco Use  . Smoking status: Former Smoker    Packs/day: 0.50    Types: Cigarettes    Quit date: 05/01/2015    Years since quitting: 5.1  . Smokeless tobacco: Never Used  Vaping Use  . Vaping Use: Never used  Substance Use Topics  . Alcohol use: Yes    Comment: occ  . Drug use: No    Home Medications Prior to Admission medications   Medication Sig Start Date End Date Taking? Authorizing Provider  Atorvastatin Calcium (LIPITOR PO) Take by mouth.     [provider]  benzonatate (TESSALON) 100 MG capsule Take 1 capsule (100 mg total) by mouth every 8 (eight) hours. 03/17/17   Georgiana Shore, PA-C  diphenhydrAMINE (BENADRYL ALLERGY) 25 mg capsule Take 1 capsule (25 mg total) by mouth every 6 (six) hours as needed. 03/25/18   Garnette Gunner, MD  ondansetron (ZOFRAN ODT) 8 MG disintegrating tablet Take 1 tablet (8 mg total) by mouth every 8 (eight) hours as needed for nausea or vomiting. 11/04/17   Azalia Bilis, MD  pseudoephedrine (SUDAFED) 30 MG tablet Take 1 tablet (30 mg total) by mouth every 4 (four) hours as needed for congestion. 03/25/18   Garnette Gunner, MD  sitaGLIPtin (JANUVIA) 100 MG tablet Take 100 mg by mouth daily.    [provider]    Allergies    Patient has no known allergies.  Review of Systems   Review of Systems  Constitutional: Negative.   HENT: Negative.   Respiratory: Negative.   Cardiovascular: Negative.   Gastrointestinal: Positive for abdominal pain and nausea. Negative for diarrhea and vomiting.  Genitourinary: Positive for hematuria, pelvic pain and vaginal bleeding. Negative for decreased urine volume, difficulty urinating, dyspareunia, dysuria, enuresis, flank pain, frequency, genital sores, menstrual problem, urgency, vaginal discharge and vaginal pain.  Musculoskeletal: Positive for back pain.  Skin: Negative.   Allergic/Immunologic: Positive for immunocompromised state.       DMT2  Neurological:  Negative.     Physical Exam Updated Vital Signs BP 135/87   Pulse 91   Temp 98.3 F (36.8 C) (Oral)   Resp 18   Ht 5\' 5"  (1.651 m)   Wt 94.3 kg   LMP 05/08/2020   SpO2 99%   BMI 34.61 kg/m   Physical Exam Vitals and nursing note reviewed.  Constitutional:      Appearance: She is obese.  HENT:     Head: Normocephalic and atraumatic.     Nose: Nose normal.     Mouth/Throat:     Mouth: Mucous membranes are dry.     Pharynx: Oropharynx is clear. Uvula midline. No  oropharyngeal exudate or posterior oropharyngeal erythema.     Tonsils: No tonsillar exudate.  Eyes:     General: Lids are normal. Vision grossly intact.        Right eye: No discharge.        Left eye: No discharge.     Extraocular Movements: Extraocular movements intact.     Conjunctiva/sclera: Conjunctivae normal.     Pupils: Pupils are equal, round, and reactive to light.  Cardiovascular:     Rate and Rhythm: Normal rate and regular rhythm.     Pulses: Normal pulses.     Heart sounds: Normal heart sounds. No murmur heard.   Pulmonary:     Effort: Pulmonary effort is normal. No respiratory distress.     Breath sounds: Normal breath sounds. No wheezing or rales.  Chest:     Chest wall: No deformity, swelling, tenderness, crepitus or edema.  Abdominal:     General: Bowel sounds are normal. There is no distension.     Palpations: Abdomen is soft.     Tenderness: There is no abdominal tenderness. There is no right CVA tenderness, left CVA tenderness, guarding or rebound.  Musculoskeletal:        General: No deformity.     Cervical back: Neck supple. No rigidity, tenderness or crepitus. No pain with movement, spinous process tenderness or muscular tenderness.     Right lower leg: No edema.     Left lower leg: No edema.  Lymphadenopathy:     Cervical: No cervical adenopathy.  Skin:    General: Skin is warm and dry.     Capillary Refill: Capillary refill takes less than 2 seconds.  Neurological:     General: No focal deficit present.     Mental Status: She is alert and oriented to person, place, and time. Mental status is at baseline.  Psychiatric:        Mood and Affect: Mood normal.    ED Results / Procedures / Treatments   Labs (all labs ordered are listed, but only abnormal results are displayed) Labs Reviewed  PREGNANCY, URINE - Abnormal; Notable for the following components:      Result Value   Preg Test, Ur POSITIVE (*)    All other components within normal limits   CBC WITH DIFFERENTIAL/PLATELET - Abnormal; Notable for the following components:   RBC 5.20 (*)    MCV 77.5 (*)    MCH 25.4 (*)    RDW 16.7 (*)    All other components within normal limits  BASIC METABOLIC PANEL - Abnormal; Notable for the following components:   Sodium 134 (*)    CO2 18 (*)    Glucose, Bld 140 (*)    All other components within normal limits  HCG, QUANTITATIVE, PREGNANCY - Abnormal; Notable for the following components:  hCG, Beta Chain, Quant, S 5,721 (*)    All other components within normal limits  ABO/RH    EKG None  Radiology US OB LESS THAN 14 WEEKS WITH OB TRANSVAGINAL  Result Date: 06/27/2020 CLINICAL DATA:  Vaginal bleeding EXAM: OBSTETRIC <14 WK US AND TRANSVAGINAL OB US TECHNIQUE: Both transabdominal and transvaginal ultrasound examinations were performed for complete evaluation of the gestation as well as the maternal uterus, adnexal regions, and pelvic cul-de-sac. Transvaginal technique was performed to assess early pregnancy. COMPARISON:  None. FINDINGS: Intrauterine gestational sac: Present Yolk sac:  Present Embryo:  Absent MSD: 8.3 mm   5 w   4 d Subchorionic hemorrhage:  None visualized. Maternal uterus/adnexae: Follicular changes are noted in the ovaries bilaterally. Minimal free fluid is seen. IMPRESSION: Probable early intrauterine gestational sac, but no fetal pole, or cardiac activity yet visualized. Recommend follow-up quantitative B-HCG levels and follow-up US in 14 days to assess viability. This recommendation follows SRU consensus guidelines: Diagnostic Criteria for Nonviable Pregnancy Early in the First Trimester. Malva Limes Engl J Med 2013; 403:4742-59; 369:1443-51. Electronically Signed   By: Alcide CleverMark  Lukens M.D.   On: 06/27/2020 20:26    Procedures Procedures   Medications Ordered in ED Medications  acetaminophen (TYLENOL) tablet 650 mg (650 mg Oral Given 06/27/20 1919)  ondansetron (ZOFRAN-ODT) disintegrating tablet 4 mg (4 mg Oral Given 06/27/20 1919)    ED  Course  I have reviewed the triage vital signs and the nursing notes.  Pertinent labs & imaging results that were available during my care of the patient were reviewed by me and considered in my medical decision making (see chart for details).    MDM Rules/Calculators/A&P                         30 year old female presents with concern for vaginal bleeding in pregnancy less than 14 weeks, with associated bilateral pelvic cramping and low back pain.  Differential diagnosis for this patient symptoms includes but is not limited to spontaneous abortion/threatened abortion/incomplete abortion, ectopic pregnancy, subchorionic hematoma, heterotopic pregnancy, implantation bleeding, molar pregnancy, cervicitis, fibroids.  Positive urine pregnancy test in triage.  Patient is tachycardic and mildly hypertensive on intake.  Vital signs otherwise normal.  Cardiopulmonary exam is normal, abdominal exam with left-sided pelvic tenderness to palpation as well as suprapubic tenderness to palpation.  Patient is noticeably anxious at time of my exam.  She is neurovascular intact in all 4 extremities. GU exam deferred by patient, as she feels it will be too traumatic given her recent c-section and infant loss.   Will proceed with CBC, quantitative hCG, ABO Rh, and OB ultrasound.  CBC with normal hemoglobin, BMP unremarkable.  Quantitive hCG 5721.  ABO Rh O+, patient is not a RhoGam candidate.  OB ultrasound revealed gestational sac present in the uterus, consistent with approximate 5-week pregnancy.  It is too early in gestational age to identify a fetal pole within the gestational sac, or any fetal heart tones.    Ultrasound findings reassuring for intrauterine pregnancy, however unable to differentiate between benign bleeding in early pregnancy and threatened or incomplete abortion at this time.  No opportunity to visualize cervical os for dilation or protrusion of tissue as patient declined GU exam. Recommend  close follow-up with your OB for repeat quantitative hCG within 48 hours.  Tracy Davila voiced understanding of her medical evaluation and treatment plan.  Each of her questions was answered to her expressed satisfaction.  Return precautions given.  Patient is stable and appropriate for discharge at this time.  This chart was dictated using voice recognition software, Dragon. Despite the best efforts of this provider to proofread and correct errors, errors may still occur which can change documentation meaning.  Final Clinical Impression(s) / ED Diagnoses Final diagnoses:  Vaginal bleeding in pregnancy    Rx / DC Orders ED Discharge Orders    None       Sherrilee Gilles 06/27/20 2150    Terrilee Files, MD 06/28/20 1042

## 2021-05-30 ENCOUNTER — Emergency Department (HOSPITAL_BASED_OUTPATIENT_CLINIC_OR_DEPARTMENT_OTHER)
Admission: EM | Admit: 2021-05-30 | Discharge: 2021-05-30 | Disposition: A | Discharge status: Home / Self Care | Payer: BLUE CROSS/BLUE SHIELD | Attending: Emergency Medicine | Admitting: Emergency Medicine

## 2021-05-30 ENCOUNTER — Other Ambulatory Visit (HOSPITAL_BASED_OUTPATIENT_CLINIC_OR_DEPARTMENT_OTHER): Payer: Self-pay

## 2021-05-30 ENCOUNTER — Other Ambulatory Visit: Payer: Self-pay

## 2021-05-30 ENCOUNTER — Encounter (HOSPITAL_BASED_OUTPATIENT_CLINIC_OR_DEPARTMENT_OTHER): Payer: Self-pay

## 2021-05-30 ENCOUNTER — Emergency Department (HOSPITAL_BASED_OUTPATIENT_CLINIC_OR_DEPARTMENT_OTHER): Payer: BLUE CROSS/BLUE SHIELD

## 2021-05-30 DIAGNOSIS — I10 Essential (primary) hypertension: Secondary | ICD-10-CM | POA: Diagnosis not present

## 2021-05-30 DIAGNOSIS — Z20822 Contact with and (suspected) exposure to covid-19: Secondary | ICD-10-CM | POA: Diagnosis not present

## 2021-05-30 DIAGNOSIS — Z7984 Long term (current) use of oral hypoglycemic drugs: Secondary | ICD-10-CM | POA: Insufficient documentation

## 2021-05-30 DIAGNOSIS — J069 Acute upper respiratory infection, unspecified: Secondary | ICD-10-CM

## 2021-05-30 DIAGNOSIS — E1165 Type 2 diabetes mellitus with hyperglycemia: Secondary | ICD-10-CM | POA: Insufficient documentation

## 2021-05-30 DIAGNOSIS — R059 Cough, unspecified: Secondary | ICD-10-CM | POA: Diagnosis present

## 2021-05-30 LAB — RESP PANEL BY RT-PCR (FLU A&B, COVID) ARPGX2
Influenza A by PCR: NEGATIVE
Influenza B by PCR: NEGATIVE
SARS Coronavirus 2 by RT PCR: NEGATIVE

## 2021-05-30 LAB — CBG MONITORING, ED: Glucose-Capillary: 259 mg/dL — ABNORMAL HIGH (ref 70–99)

## 2021-05-30 MED ORDER — BENZONATATE 100 MG PO CAPS
100.0000 mg | ORAL_CAPSULE | Freq: Three times a day (TID) | ORAL | 0 refills | Status: AC
  Filled 2021-05-30: qty 21, 7d supply, fill #0

## 2021-05-30 MED ORDER — ALBUTEROL SULFATE HFA 108 (90 BASE) MCG/ACT IN AERS
1.0000 | INHALATION_SPRAY | Freq: Once | RESPIRATORY_TRACT | Status: AC
  Administered 2021-05-30: 2 via RESPIRATORY_TRACT
  Filled 2021-05-30: qty 6.7

## 2021-05-30 MED ORDER — BENZONATATE 100 MG PO CAPS: 100.0000 mg | ORAL_CAPSULE | Freq: Three times a day (TID) | ORAL | 0 refills | Status: DC

## 2021-05-30 MED ORDER — AEROCHAMBER PLUS FLO-VU MEDIUM MISC
1.0000 | Freq: Once | Status: AC
  Administered 2021-05-30: 1
  Filled 2021-05-30: qty 1

## 2021-05-30 NOTE — ED Notes (Signed)
Patient educated on the use of MDI with and without spacer. Patient had good effort and understanding.

## 2021-05-30 NOTE — Discharge Instructions (Addendum)
You are seen here today for evaluation of your cough and cold symptoms.  You tested negative for COVID and flu.  This is likely a viral illness.

## 2021-05-30 NOTE — ED Provider Notes (Signed)
[place] EMERGENCY DEPARTMENT Provider Note   CSN: [id number] Arrival date & time: 05/30/21  1142     History Chief Complaint  Patient presents with   Cough    Tracy Davila is a 31 y.o. female presents to the ED for evaluation of cough, nasal congestion, rhinorrhea, headache, sore throat, myalgias for the past 4 days.  She has tried Mucinex cold and flu as well as some saline nasal spray without any relief.  She denies any fevers, chest pain, shortness of breath, abdominal pain, nausea, vomiting, or diarrhea.  She has a history of diabetes.   Cough Associated symptoms: headaches, rhinorrhea and sore throat   Associated symptoms: no chest pain, no chills, no fever, no myalgias and no shortness of breath       Home Medications Prior to Admission medications   Medication Sig Start Date End Date Taking? Authorizing Provider  Atorvastatin Calcium (LIPITOR PO) Take by mouth.    [provider]  benzonatate (TESSALON) 100 MG capsule Take 1 capsule (100 mg total) by mouth every 8 (eight) hours. 03/17/17   Emeline General, PA-C  diphenhydrAMINE (BENADRYL ALLERGY) 25 mg capsule Take 1 capsule (25 mg total) by mouth every 6 (six) hours as needed. 03/25/18   Bonnita Hollow, MD  ondansetron (ZOFRAN ODT) 8 MG disintegrating tablet Take 1 tablet (8 mg total) by mouth every 8 (eight) hours as needed for nausea or vomiting. 11/04/17   Jola Schmidt, MD  pseudoephedrine (SUDAFED) 30 MG tablet Take 1 tablet (30 mg total) by mouth every 4 (four) hours as needed for congestion. 03/25/18   Bonnita Hollow, MD  sitaGLIPtin (JANUVIA) 100 MG tablet Take 100 mg by mouth daily.    [provider]      Allergies    Patient has no known allergies.    Review of Systems   Review of Systems  Constitutional:  Negative for chills and fever.  HENT:  Positive for congestion, rhinorrhea and sore throat.   Respiratory:  Positive for cough. Negative for shortness of breath.    Cardiovascular:  Negative for chest pain.  Gastrointestinal:  Negative for abdominal pain, diarrhea, nausea and vomiting.  Musculoskeletal:  Negative for myalgias.  Neurological:  Positive for headaches.   Physical Exam Updated Vital Signs BP (!) 139/93 (BP Location: Left Arm)    Pulse (!) 111    Temp 98.4 F (36.9 C) (Oral)    Resp 20    Ht 5\' 5"  (1.651 m)    Wt 96.6 kg    LMP 05/26/2021    SpO2 97%    BMI 35.45 kg/m  Physical Exam Vitals and nursing note reviewed.  Constitutional:      General: She is not in acute distress.    Appearance: Normal appearance. She is not toxic-appearing.  HENT:     Head: Normocephalic and atraumatic.     Right Ear: Tympanic membrane, ear canal and external ear normal.     Left Ear: Tympanic membrane, ear canal and external ear normal.     Nose:     Comments: Bilateral nasal turbinate edema and erythema with scant clear nasal discharge.    Mouth/Throat:     Mouth: Mucous membranes are moist.     Comments: No pharyngeal erythema, exudate, or edema noted.  Uvula midline.  Airway patent.  Moist mucous membranes. Eyes:     General: No scleral icterus.    Conjunctiva/sclera: Conjunctivae normal.  Cardiovascular:     Rate and  Rhythm: Normal rate and regular rhythm.  Pulmonary:     Effort: Pulmonary effort is normal. No respiratory distress.     Breath sounds: Normal breath sounds.     Comments: Slightly diminished breath sounds.  No respiratory distress, facial muscle use, nasal flaring, tripoding, or cyanosis present.  Patient speaking in full sentences with ease.  Patient satting 98% on room air. Abdominal:     General: Abdomen is flat. Bowel sounds are normal.     Palpations: Abdomen is soft.  Musculoskeletal:        General: No deformity.     Cervical back: Normal range of motion.  Skin:    General: Skin is warm and dry.     Findings: No rash.  Neurological:     General: No focal deficit present.     Mental Status: She is alert. Mental  status is at baseline.    ED Results / Procedures / Treatments   Labs (all labs ordered are listed, but only abnormal results are displayed) Labs Reviewed  RESP PANEL BY RT-PCR (FLU A&B, COVID) ARPGX2    EKG None  Radiology No results found.  Procedures Procedures   Medications Ordered in ED Medications  albuterol (VENTOLIN HFA) 108 (90 Base) MCG/ACT inhaler 1-2 puff (has no administration in time range)  AeroChamber Plus Flo-Vu Medium MISC 1 each (has no administration in time range)    ED Course/ Medical Decision Making/ A&P                           Medical Decision Making Amount and/or Complexity of Data Reviewed Radiology: ordered.  Risk Prescription drug management.   31 year old female with history of diabetes presents to the emergency department with cough and cold symptoms.  Differential diagnosis includes is not limited to COVID, flu, strep, viral illness, pneumonia, sinusitis.  Vital signs show mild hypertension of 135/92.  The patient was tachycardic mildly during her stay here which is likely due to the Mucinex, but the patient normally stays in the upper 90s to tachycardic with review of her recent charts.  Physical exam shows some bilateral nasal turbinate edema and erythema with scant clear nasal discharge.  No edema, erythema, or exudate noted in her OP.  Patient has slightly diminished breath sounds.  Will order chest x-ray and COVID and flu testing.  At this time, low suspicion for any strep throat as patient only has a sore throat whenever she coughs and her physical exam is not consistent with strep throat.  Patient does not meet MIPS criteria for sinusitis treatment as she has only been sick for 4 days.  I independently reviewed and interpreted the patient's labs and imaging.  COVID and flu negative.  I agree with the radiologist interpretation of the chest x-ray which shows no active cardiopulmonary process.  No pneumonia or pneumothorax  visualized.  Breathing treatment ordered with an albuterol inhaler and mask for her to take home.  On evaluation, the patient reports that she feels much better after the breathing treatment.  Auscultated lungs and the patient sounds better and that she is moving good air.  Given the reassuring clinical picture, the fact the patient is feeling much better, patient will be safe for discharge home with over-the-counter cough and cold medication treatment a conjunction to Tessalon Perles and her albuterol inhaler to use for cough.  I advised her to not take any Mucinex as this can worsen her tachycardia and can dehydrate  her.  Recommended Tylenol or Alka-Seltzer cold and flu or other generics for treatment.  Return precautions discussed.  Patient agrees to plan.  Patient is stable and being discharged home in good condition.  Final Clinical Impression(s) / ED Diagnoses Final diagnoses:  Viral URI with cough    Rx / DC Orders ED Discharge Orders          Ordered    benzonatate (TESSALON) 100 MG capsule  Every 8 hours,   Status:  Discontinued        05/30/21 1659    benzonatate (TESSALON) 100 MG capsule  Every 8 hours        05/30/21 1659              Sherrell Puller, PA-C 06/02/21 2306    Fredia Sorrow, MD 06/06/21 2348

## 2021-05-30 NOTE — ED Triage Notes (Signed)
Pt c/o flu like sx x 4 days-NAD-steady gait 

## 2022-09-05 IMAGING — US US OB < 14 WEEKS - US OB TV
1 series · 14 of 28 positions shown · non-contrast
Comparison: None.

CLINICAL DATA: Vaginal bleeding

EXAM:
OBSTETRIC <14 WK US AND TRANSVAGINAL OB US
TECHNIQUE: Both transabdominal and transvaginal ultrasound examinations were
performed for complete evaluation of the gestation as well as the
maternal uterus, adnexal regions, and pelvic cul-de-sac.
Transvaginal technique was performed to assess early pregnancy.

[Series 1: us ob < 14 weeks - us ob tv · 14 of 80 slices shown]
[im 3/80]
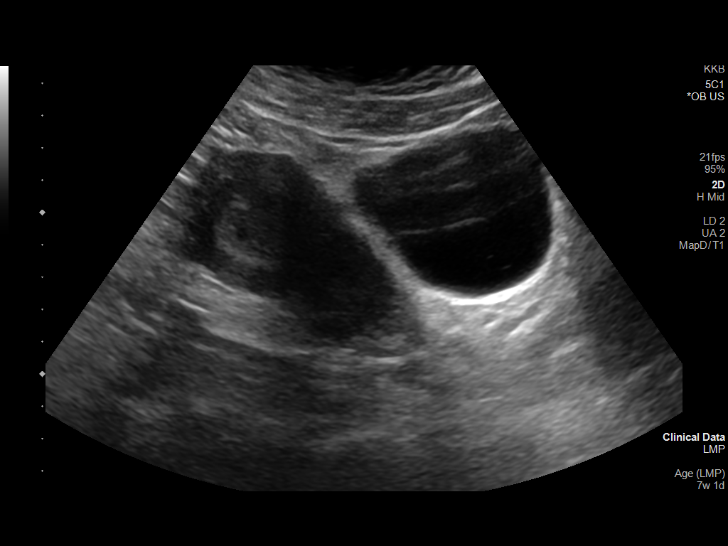
[im 9/80]
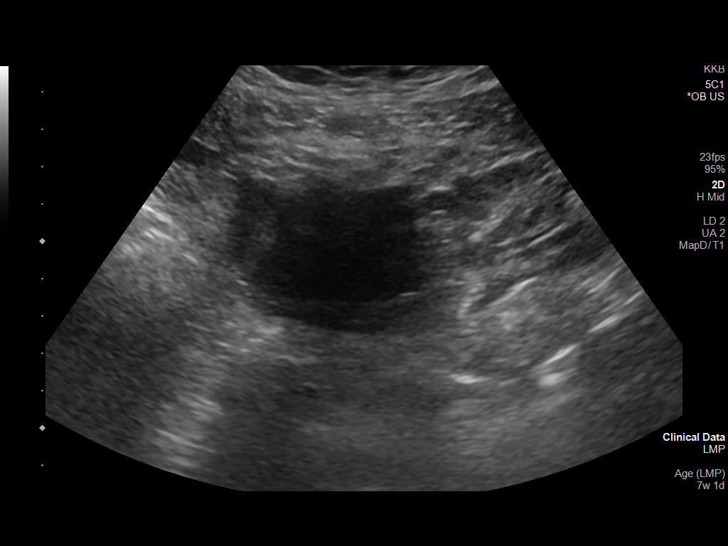
[im 15/80]
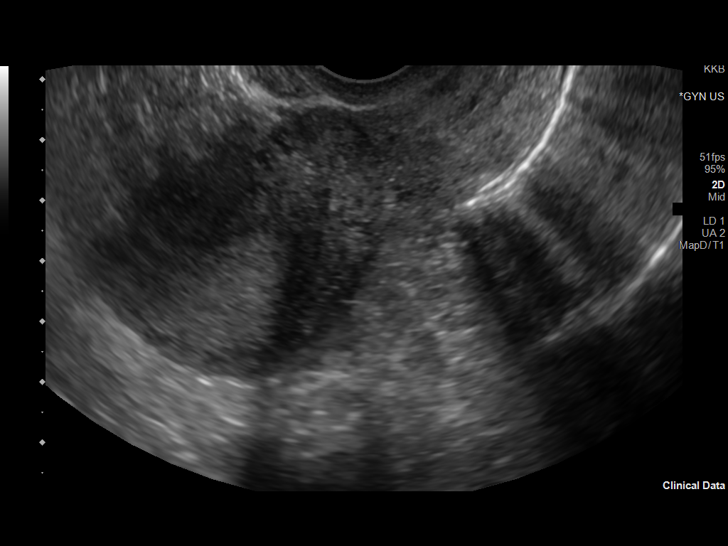
[im 21/80]
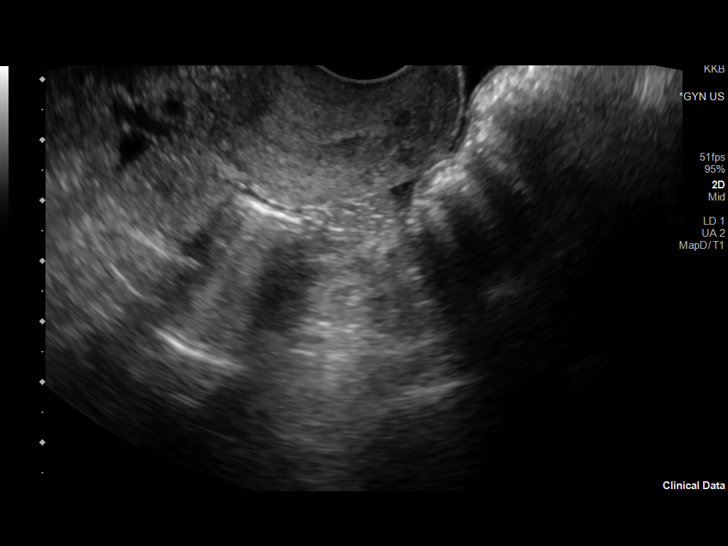
[im 27/80]
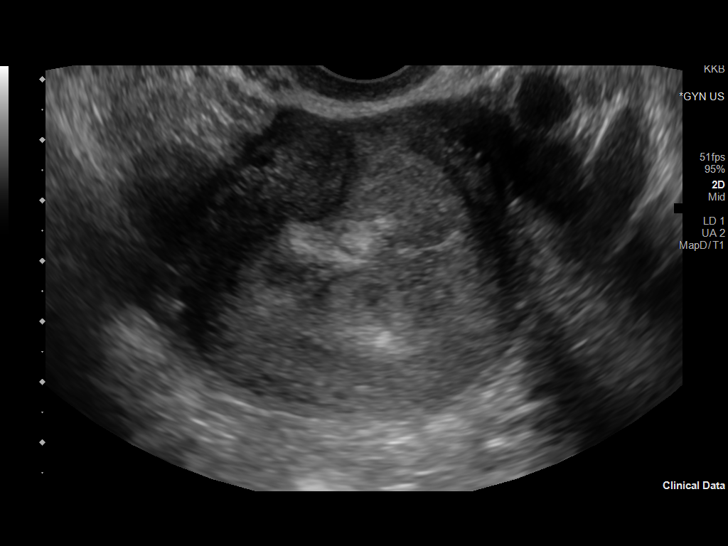
[im 33/80]
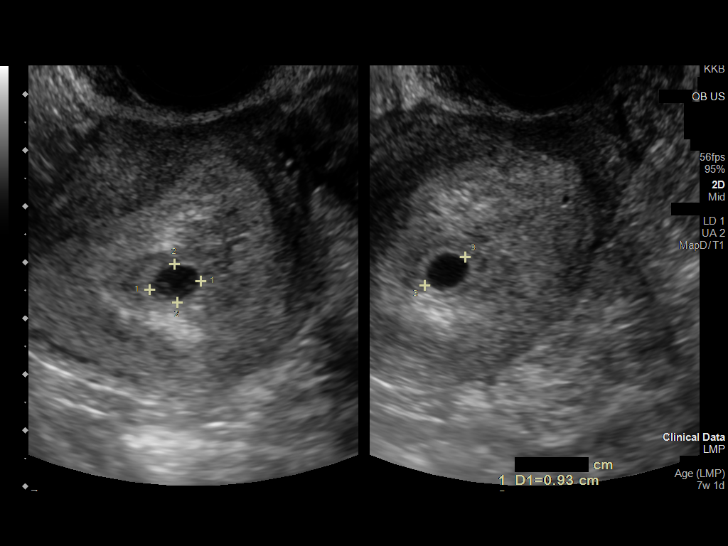
[im 39/80]
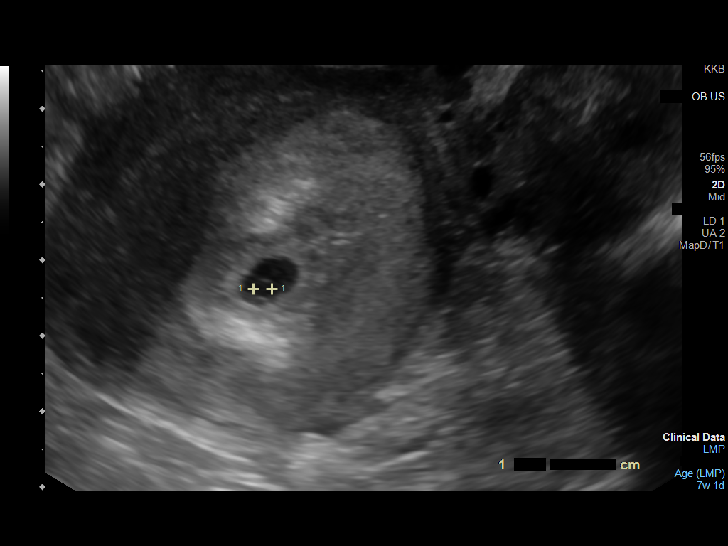
[im 44/80]
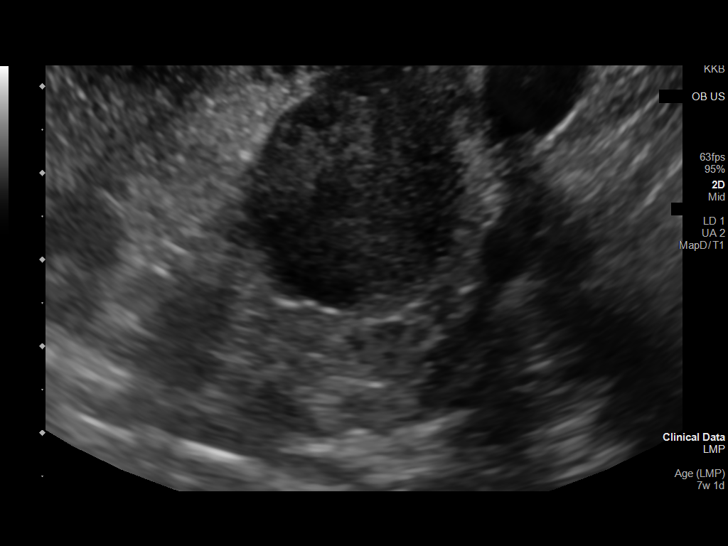
[im 50/80]
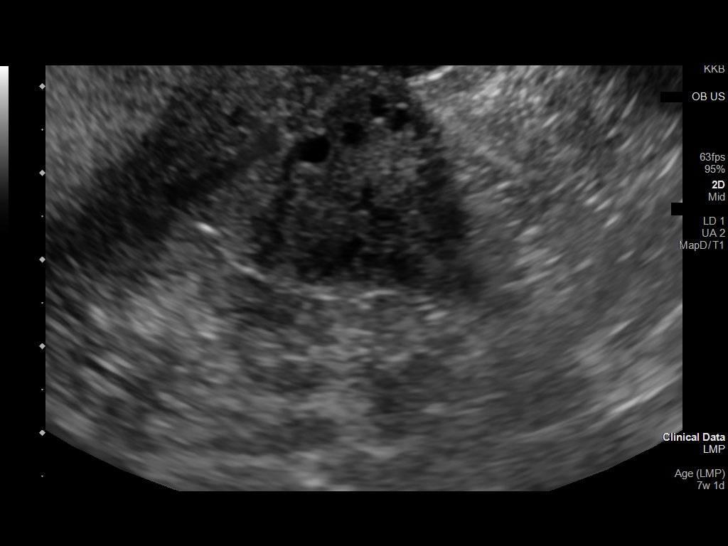
[im 56/80]
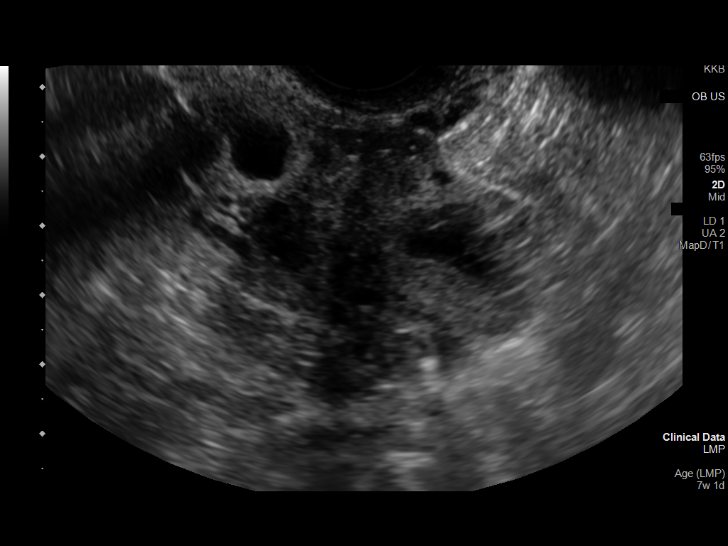
[im 62/80]
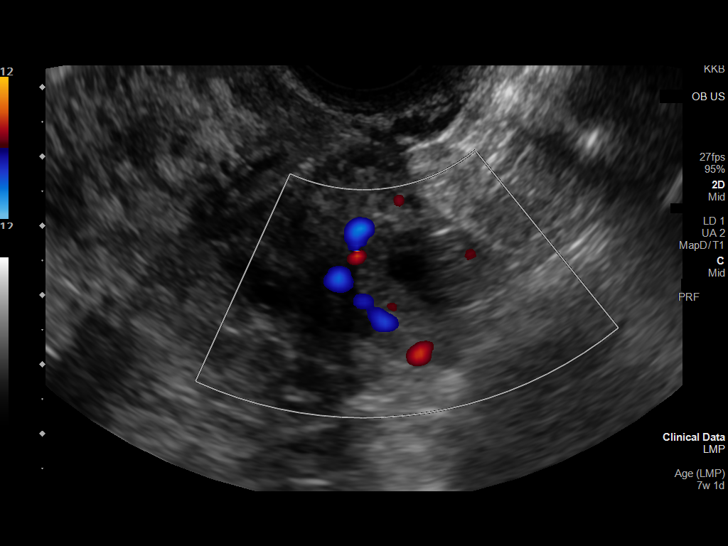
[im 68/80]
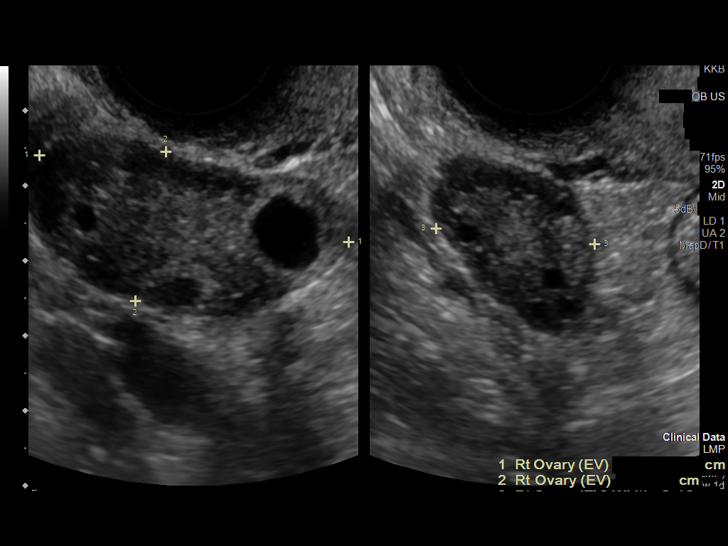
[im 74/80]
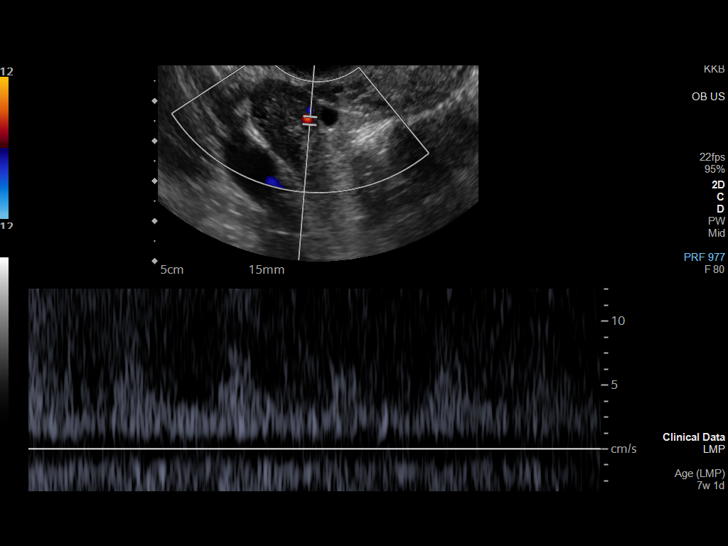
[im 80/80]
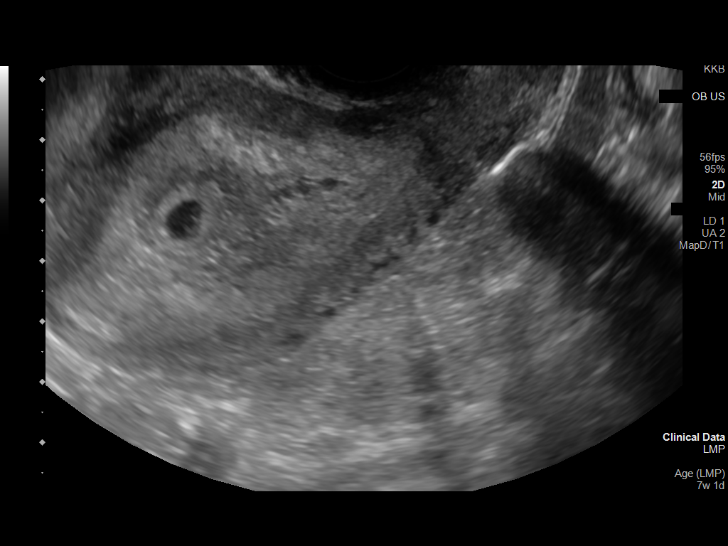

[14 of 28 positions shown; findings below may reference images not displayed]

FINDINGS: Intrauterine gestational sac: Present

Yolk sac:  Present

Embryo:  Absent

MSD: 8.3 mm   5 w   4 d

Subchorionic hemorrhage:  None visualized.

Maternal uterus/adnexae: Follicular changes are noted in the ovaries
bilaterally. Minimal free fluid is seen.
IMPRESSION: Probable early intrauterine gestational sac, but no fetal pole, or
cardiac activity yet visualized. Recommend follow-up quantitative
B-HCG levels and follow-up US in 14 days to assess viability. This
recommendation follows SRU consensus guidelines: Diagnostic Criteria
for Nonviable Pregnancy Early in the First Trimester. N Engl J Med

## 2022-11-18 ENCOUNTER — Encounter (HOSPITAL_BASED_OUTPATIENT_CLINIC_OR_DEPARTMENT_OTHER): Payer: Self-pay

## 2022-11-18 ENCOUNTER — Emergency Department (HOSPITAL_BASED_OUTPATIENT_CLINIC_OR_DEPARTMENT_OTHER)
Admission: EM | Admit: 2022-11-18 | Discharge: 2022-11-18 | Disposition: A | Discharge status: Home / Self Care | Payer: Managed Care, Other (non HMO)

## 2022-11-18 ENCOUNTER — Other Ambulatory Visit: Payer: Self-pay

## 2022-11-18 DIAGNOSIS — E119 Type 2 diabetes mellitus without complications: Secondary | ICD-10-CM | POA: Diagnosis not present

## 2022-11-18 DIAGNOSIS — Z7984 Long term (current) use of oral hypoglycemic drugs: Secondary | ICD-10-CM | POA: Insufficient documentation

## 2022-11-18 DIAGNOSIS — K029 Dental caries, unspecified: Secondary | ICD-10-CM | POA: Insufficient documentation

## 2022-11-18 DIAGNOSIS — K0889 Other specified disorders of teeth and supporting structures: Secondary | ICD-10-CM | POA: Diagnosis present

## 2022-11-18 MED ORDER — HYDROCODONE-ACETAMINOPHEN 5-325 MG PO TABS
1.0000 | ORAL_TABLET | Freq: Once | ORAL | Status: AC
  Administered 2022-11-18: 1 via ORAL
  Filled 2022-11-18: qty 1

## 2022-11-18 MED ORDER — LIDOCAINE VISCOUS HCL 2 % MT SOLN
5.0000 mL | Freq: Three times a day (TID) | OROMUCOSAL | 0 refills | Status: AC
Start: 2022-11-18 — End: ?

## 2022-11-18 MED ORDER — AMOXICILLIN-POT CLAVULANATE 875-125 MG PO TABS
1.0000 | ORAL_TABLET | Freq: Once | ORAL | Status: AC
Start: 2022-11-18 — End: 2022-11-18
  Administered 2022-11-18: 1 via ORAL
  Filled 2022-11-18: qty 1

## 2022-11-18 MED ORDER — AMOXICILLIN-POT CLAVULANATE 875-125 MG PO TABS: 1.0000 | ORAL_TABLET | Freq: Two times a day (BID) | ORAL | 0 refills | Status: AC

## 2022-11-18 MED ORDER — KETOROLAC TROMETHAMINE 15 MG/ML IJ SOLN
15.0000 mg | Freq: Once | INTRAMUSCULAR | Status: AC
  Administered 2022-11-18: 15 mg via INTRAVENOUS
  Filled 2022-11-18: qty 1

## 2022-11-18 NOTE — ED Triage Notes (Signed)
In for eval of right sided facial swelling, swollen lymph node under right jaw, headache, and biting the inside of right cheek. She is working on scheduling a Cytogeneticist. Onset 3 days ago. Tylenol OTC, last dose yesterday pm.

## 2022-11-18 NOTE — ED Provider Notes (Signed)
[place] [facility] Provider Note   CSN: [id number] Arrival date & time: 11/18/22  1217     History  Chief Complaint  Patient presents with   Dental Pain    Tracy Davila is a 32 y.o. female with medical history of diabetes.  The patient presents to the ED for evaluation of right-sided dental pain and facial swelling.  The patient reports that 3 days ago she developed dental pain on the right lower portion of her jawline.  She states that 2 days ago she developed facial swelling on the right side of her face.  She states that she has not seen a dentist in quite some time, she states that she is currently in the process of setting up an appointment with a dentist for dental cleaning.  She states that she has been taking ibuprofen and Tylenol at home without relief of pain.  She denies any trouble swallowing, nausea, vomiting, drooling, change in phonation, fevers, body aches or chills.   Dental Pain Associated symptoms: facial swelling   Associated symptoms: no fever        Home Medications Prior to Admission medications   Medication Sig Start Date End Date Taking? Authorizing Provider  ALPRAZolam Prudy Feeler) 0.5 MG tablet Take 0.5 mg by mouth 2 (two) times daily as needed for anxiety.   Yes [provider]  amoxicillin-clavulanate (AUGMENTIN) 875-125 MG tablet Take 1 tablet by mouth 2 (two) times daily. 11/18/22  Yes Al Decant, PA-C  Dulaglutide (TRULICITY) 0.75 MG/0.5ML SOPN Inject 0.75 mg into the skin once a week.   Yes [provider]  FLUoxetine (PROZAC) 20 MG capsule Take 20 mg by mouth daily.   Yes [provider]  magic mouthwash (lidocaine, diphenhydrAMINE, alum & mag hydroxide) suspension Swish and spit 5 mLs 3 (three) times daily. 11/18/22  Yes Al Decant, PA-C  metformin (FORTAMET) 500 MG (OSM) 24 hr tablet Take 500 mg by mouth 2 (two) times daily with a meal.   Yes [provider]  Atorvastatin Calcium (LIPITOR PO) Take by mouth.    [provider]  benzonatate (TESSALON) 100 MG capsule Take 1 capsule (100 mg total) by mouth every 8 (eight) hours. 05/30/21   Achille Rich, PA-C  diphenhydrAMINE (BENADRYL ALLERGY) 25 mg capsule Take 1 capsule (25 mg total) by mouth every 6 (six) hours as needed. 03/25/18   Garnette Gunner, MD  ondansetron (ZOFRAN ODT) 8 MG disintegrating tablet Take 1 tablet (8 mg total) by mouth every 8 (eight) hours as needed for nausea or vomiting. 11/04/17   Azalia Bilis, MD  pseudoephedrine (SUDAFED) 30 MG tablet Take 1 tablet (30 mg total) by mouth every 4 (four) hours as needed for congestion. 03/25/18   Garnette Gunner, MD  sitaGLIPtin (JANUVIA) 100 MG tablet Take 100 mg by mouth daily.    [provider]      Allergies    Patient has no known allergies.    Review of Systems   Review of Systems  Constitutional:  Negative for chills and fever.  HENT:  Positive for dental problem and facial swelling.   Musculoskeletal:  Negative for myalgias.  All other systems reviewed and are negative.   Physical Exam Updated Vital Signs BP 125/87 (BP Location: Left Arm)   Pulse 94   Temp 98.2 F (36.8 C) (Oral)   Resp 20   Ht 5\' 5"  (1.651 m)   Wt 86.2 kg   SpO2 99%  BMI 31.62 kg/m  Physical Exam Vitals and nursing note reviewed.  Constitutional:      General: She is not in acute distress.    Appearance: Normal appearance. She is not ill-appearing, toxic-appearing or diaphoretic.  HENT:     Head: Normocephalic and atraumatic.     Comments: No obvious right-sided facial swelling.    Nose: Nose normal.     Mouth/Throat:     Mouth: Mucous membranes are moist.     Pharynx: Oropharynx is clear.     Comments: Dental caries throughout.  No obvious abscess in need of drainage.  Uvula is midline.  No change in phonation. Eyes:     Extraocular Movements: Extraocular movements intact.     Conjunctiva/sclera: Conjunctivae normal.      Pupils: Pupils are equal, round, and reactive to light.  Cardiovascular:     Rate and Rhythm: Normal rate and regular rhythm.  Pulmonary:     Effort: Pulmonary effort is normal.     Breath sounds: Normal breath sounds. No wheezing.  Abdominal:     General: Abdomen is flat. Bowel sounds are normal.     Palpations: Abdomen is soft.     Tenderness: There is no abdominal tenderness.  Musculoskeletal:     Cervical back: Normal range of motion and neck supple. No tenderness.  Skin:    General: Skin is warm and dry.     Capillary Refill: Capillary refill takes less than 2 seconds.  Neurological:     Mental Status: She is alert and oriented to person, place, and time.     ED Results / Procedures / Treatments   Labs (all labs ordered are listed, but only abnormal results are displayed) Labs Reviewed - No data to display  EKG None  Radiology No results found.  Procedures Procedures   Medications Ordered in ED Medications  HYDROcodone-acetaminophen (NORCO/VICODIN) 5-325 MG per tablet 1 tablet (1 tablet Oral Given 11/18/22 1326)  amoxicillin-clavulanate (AUGMENTIN) 875-125 MG per tablet 1 tablet (1 tablet Oral Given 11/18/22 1326)  ketorolac (TORADOL) 15 MG/ML injection 15 mg (15 mg Intravenous Given 11/18/22 1326)    ED Course/ Medical Decision Making/ A&P  Medical Decision Making  32 year old female presents to the ED for evaluation.  Please see HPI for further details.  On examination the patient is afebrile and nontachycardic.  Her lung sounds are clear bilaterally and she is not hypoxic on room air.  Her abdomen is soft and compressible throughout.  Neurological examination at baseline.  Patient has no obvious right-sided facial swelling.  She does have dental caries throughout but no obvious abscess in need of drainage.  Her uvula is midline, she is handling secretions appropriately, there is no change in phonation.  She is overall nontoxic in appearance.  Her vital signs  are reassuring.  Shared decision-making conversation was had with the patient.  I offered lab work, CT scans of the patient's face.  She is deferring on this at this time.  She states that she is set to follow-up with a dentist soon and she is she is here for pain control.  I provided the patient 5 mg of hydrocodone, Toradol.  I also give the patient her initial dose of Augmentin here.  I advised the patient that I would not be able to send her home with pain medication but I am suggesting that she take ibuprofen and Tylenol in alternating fashion.  I have sent her home with antibiotics for possible infection.  Also have sent  her home with Magic mouthwash.  Patient will be discharged with list of dental resources and advised to follow-up.  She was advised to return to the ED with any new or worsening signs or symptoms and she voiced understanding.  She had all her questions answered to her satisfaction.  She is stable to discharge at this time.   Final Clinical Impression(s) / ED Diagnoses Final diagnoses:  Pain due to dental caries    Rx / DC Orders ED Discharge Orders          Ordered    amoxicillin-clavulanate (AUGMENTIN) 875-125 MG tablet  2 times daily        11/18/22 1357    magic mouthwash (lidocaine, diphenhydrAMINE, alum & mag hydroxide) suspension  3 times daily        11/18/22 1357              Al Decant, PA-C 11/18/22 1404    Coral Spikes, DO 11/18/22 1507

## 2022-11-18 NOTE — Discharge Instructions (Addendum)
It was a pleasure taking part in your care today.  As we discussed, you will need to follow-up with a dentist for further care of your tooth ache.  I am sending you home with Augmentin in the event that you have an infection.  Please take this antibiotic twice daily for the next 7 days, you received your first dose here today.  You may take Tylenol or ibuprofen every 6 hours as needed for pain.  You may also utilize Magic mouthwash which I prescribed to your pharmacy.  Please read the attached dental resource guide and follow-up with a dentist on this list for further care.  If you begin to develop fevers, nausea, vomiting, body aches or chills please return to the ED for further management and care.

## 2023-04-22 ENCOUNTER — Emergency Department (HOSPITAL_BASED_OUTPATIENT_CLINIC_OR_DEPARTMENT_OTHER)
Admission: EM | Admit: 2023-04-22 | Discharge: 2023-04-22 | Disposition: A | Discharge status: Home / Self Care | Payer: Managed Care, Other (non HMO) | Attending: Emergency Medicine | Admitting: Emergency Medicine

## 2023-04-22 ENCOUNTER — Emergency Department (HOSPITAL_BASED_OUTPATIENT_CLINIC_OR_DEPARTMENT_OTHER): Payer: Managed Care, Other (non HMO)

## 2023-04-22 ENCOUNTER — Other Ambulatory Visit: Payer: Self-pay

## 2023-04-22 ENCOUNTER — Encounter (HOSPITAL_BASED_OUTPATIENT_CLINIC_OR_DEPARTMENT_OTHER): Payer: Self-pay

## 2023-04-22 DIAGNOSIS — M542 Cervicalgia: Secondary | ICD-10-CM | POA: Insufficient documentation

## 2023-04-22 DIAGNOSIS — Y92410 Unspecified street and highway as the place of occurrence of the external cause: Secondary | ICD-10-CM | POA: Diagnosis not present

## 2023-04-22 DIAGNOSIS — M545 Low back pain, unspecified: Secondary | ICD-10-CM | POA: Insufficient documentation

## 2023-04-22 LAB — PREGNANCY, URINE: Preg Test, Ur: NEGATIVE

## 2023-04-22 MED ORDER — CYCLOBENZAPRINE HCL 10 MG PO TABS
10.0000 mg | ORAL_TABLET | Freq: Once | ORAL | Status: AC
  Administered 2023-04-22: 10 mg via ORAL
  Filled 2023-04-22: qty 1

## 2023-04-22 MED ORDER — CYCLOBENZAPRINE HCL 10 MG PO TABS
10.0000 mg | ORAL_TABLET | Freq: Two times a day (BID) | ORAL | 0 refills | Status: AC | PRN
Start: 2023-04-22 — End: ?

## 2023-04-22 MED ORDER — IBUPROFEN 600 MG PO TABS: 600.0000 mg | ORAL_TABLET | Freq: Four times a day (QID) | ORAL | 0 refills | Status: AC | PRN

## 2023-04-22 MED ORDER — IBUPROFEN 800 MG PO TABS
800.0000 mg | ORAL_TABLET | Freq: Once | ORAL | Status: AC
  Administered 2023-04-22: 800 mg via ORAL
  Filled 2023-04-22: qty 1

## 2023-04-22 NOTE — ED Notes (Signed)
 Pt alert and oriented X 4 at the time of discharge. RR even and unlabored. No acute distress noted. Pt verbalized understanding of discharge instructions as discussed. Pt ambulatory to lobby at time of discharge.
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
## 2023-04-22 NOTE — Discharge Instructions (Signed)
 Imaging of your lumbar, thoracic and cervical spine were negative for any fracture or dislocation.  Will treat your symptoms at home with anti-inflammatories in the form of ibuprofen  as well as muscle laxer to use as needed in the form of Flexeril .  Note the muscle laxer can cause drowsiness so please not drive or perform any high risk activity and to realize this medications effects on you.  Expect symptoms to worsen over the next day or 2 before they begin to get better.  Please do not hesitate to return if the worrisome signs and symptoms we discussed become apparent.
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
